# Patient Record
Sex: Female | Born: 1950 | Race: White | Hispanic: No | Marital: Married | Smoking: Never smoker
Health system: Southern US, Community
[De-identification: ages and names within clinical notes are randomized; demographics above are authoritative.]

## PROBLEM LIST (undated history)

## (undated) DIAGNOSIS — K219 Gastro-esophageal reflux disease without esophagitis: Secondary | ICD-10-CM

## (undated) DIAGNOSIS — Q2733 Arteriovenous malformation of digestive system vessel: Secondary | ICD-10-CM

## (undated) DIAGNOSIS — I10 Essential (primary) hypertension: Secondary | ICD-10-CM

## (undated) DIAGNOSIS — K5520 Angiodysplasia of colon without hemorrhage: Secondary | ICD-10-CM

## (undated) DIAGNOSIS — K648 Other hemorrhoids: Secondary | ICD-10-CM

## (undated) DIAGNOSIS — I7121 Aneurysm of the ascending aorta, without rupture: Secondary | ICD-10-CM

## (undated) DIAGNOSIS — E785 Hyperlipidemia, unspecified: Secondary | ICD-10-CM

## (undated) HISTORY — DX: Hyperlipidemia, unspecified: E78.5

## (undated) HISTORY — DX: Angiodysplasia of colon without hemorrhage: K55.20

## (undated) HISTORY — DX: Gastro-esophageal reflux disease without esophagitis: K21.9

## (undated) HISTORY — DX: Other hemorrhoids: K64.8

## (undated) HISTORY — PX: CHOLECYSTECTOMY: SHX55

## (undated) HISTORY — DX: Essential (primary) hypertension: I10

## (undated) HISTORY — DX: Aneurysm of the ascending aorta, without rupture: I71.21

## (undated) HISTORY — DX: Arteriovenous malformation of digestive system vessel: Q27.33

## (undated) HISTORY — PX: TONSILLECTOMY: SUR1361

---

## 2000-11-09 ENCOUNTER — Other Ambulatory Visit: Admission: RE | Admit: 2000-11-09 | Discharge: 2000-11-09 | Payer: Self-pay | Admitting: Obstetrics and Gynecology

## 2000-11-16 ENCOUNTER — Ambulatory Visit (HOSPITAL_COMMUNITY): Admission: RE | Admit: 2000-11-16 | Discharge: 2000-11-16 | Payer: Self-pay | Admitting: Obstetrics and Gynecology

## 2000-11-16 ENCOUNTER — Encounter: Payer: Self-pay | Admitting: Obstetrics and Gynecology

## 2002-07-05 ENCOUNTER — Encounter: Admission: RE | Admit: 2002-07-05 | Discharge: 2002-07-05 | Payer: Self-pay | Admitting: Family Medicine

## 2002-07-05 ENCOUNTER — Encounter: Payer: Self-pay | Admitting: Family Medicine

## 2004-02-14 ENCOUNTER — Ambulatory Visit (HOSPITAL_COMMUNITY): Admission: RE | Admit: 2004-02-14 | Discharge: 2004-02-14 | Payer: Self-pay | Admitting: Obstetrics and Gynecology

## 2005-08-06 ENCOUNTER — Ambulatory Visit (HOSPITAL_COMMUNITY): Admission: RE | Admit: 2005-08-06 | Discharge: 2005-08-06 | Payer: Self-pay | Admitting: Obstetrics and Gynecology

## 2006-11-22 ENCOUNTER — Ambulatory Visit (HOSPITAL_COMMUNITY): Admission: RE | Admit: 2006-11-22 | Discharge: 2006-11-22 | Payer: Self-pay | Admitting: Obstetrics and Gynecology

## 2006-11-29 ENCOUNTER — Other Ambulatory Visit: Admission: RE | Admit: 2006-11-29 | Discharge: 2006-11-29 | Payer: Self-pay | Admitting: Obstetrics and Gynecology

## 2007-12-29 ENCOUNTER — Encounter: Admission: RE | Admit: 2007-12-29 | Discharge: 2007-12-29 | Payer: Self-pay | Admitting: Obstetrics and Gynecology

## 2008-03-22 ENCOUNTER — Encounter (INDEPENDENT_AMBULATORY_CARE_PROVIDER_SITE_OTHER): Payer: Self-pay | Admitting: *Deleted

## 2008-03-22 ENCOUNTER — Ambulatory Visit (HOSPITAL_COMMUNITY): Admission: RE | Admit: 2008-03-22 | Discharge: 2008-03-22 | Payer: Self-pay | Admitting: *Deleted

## 2009-04-07 IMAGING — US US ABDOMEN COMPLETE
1 series · 13 of 25 positions shown · non-contrast
Comparison: Ultrasound of the abdomen of 07/05/2002

CLINICAL DATA: Right upper quadrant abdominal pain

ABDOMEN ULTRASOUND
TECHNIQUE: Complete abdominal ultrasound examination was performed
including evaluation of the liver, gallbladder, bile ducts,
pancreas, kidneys, spleen, IVC, and abdominal aorta.

[Series 1: us abdomen complete · 0.22mm/px · 13 of 69 slices shown]
[im 1/69]
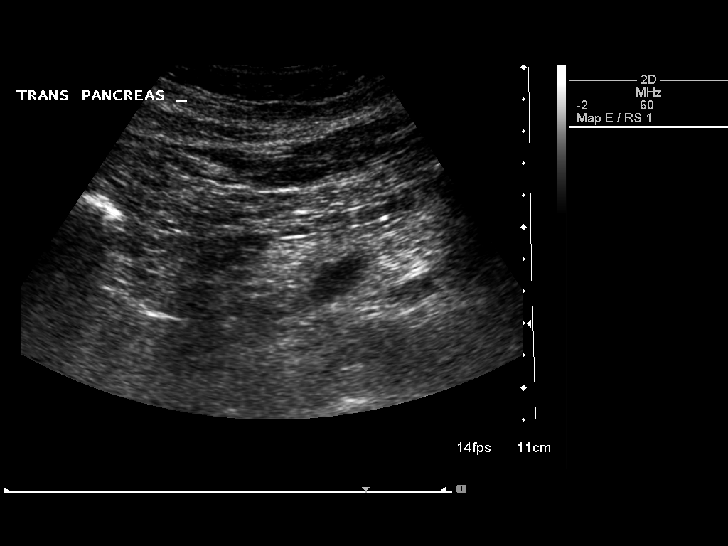
[im 6/69]
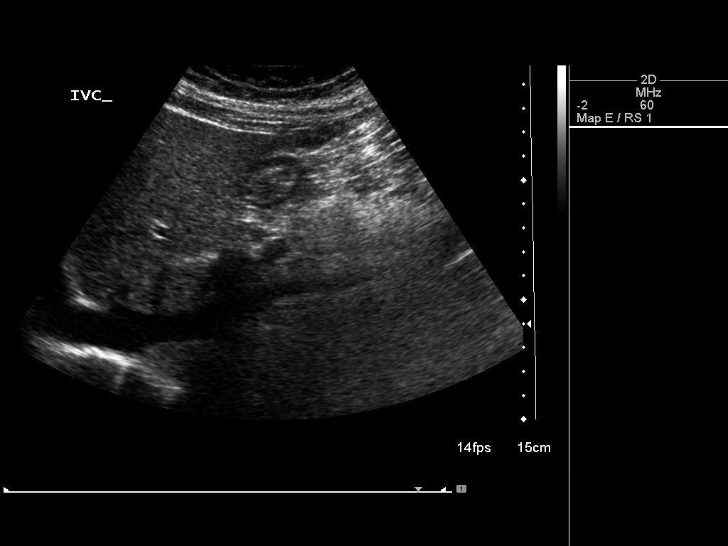
[im 12/69]
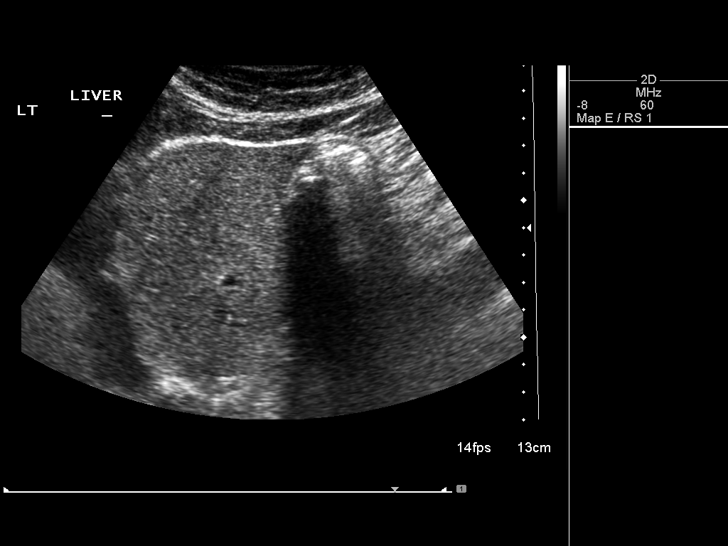
[im 18/69]
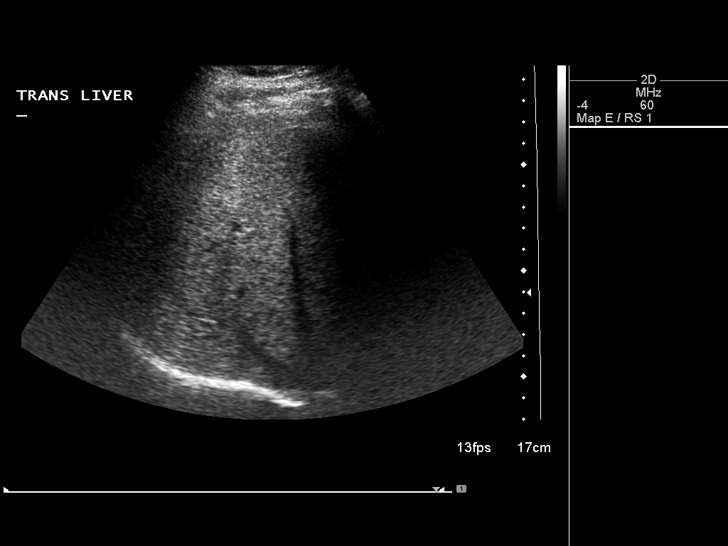
[im 23/69]
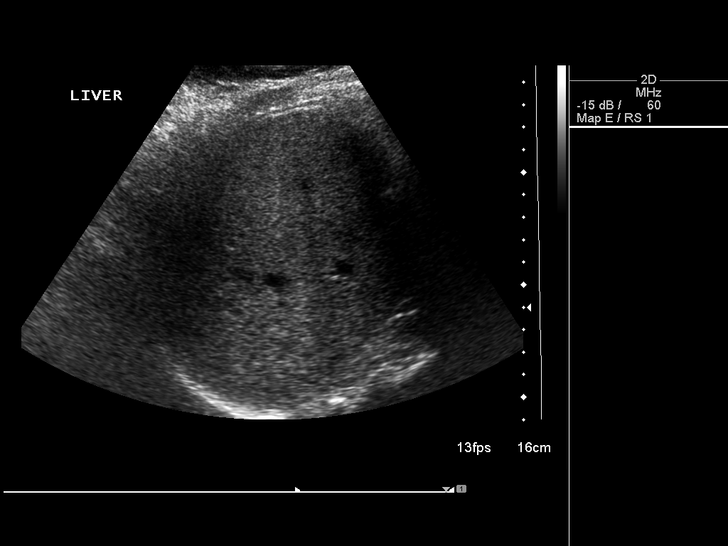
[im 29/69]
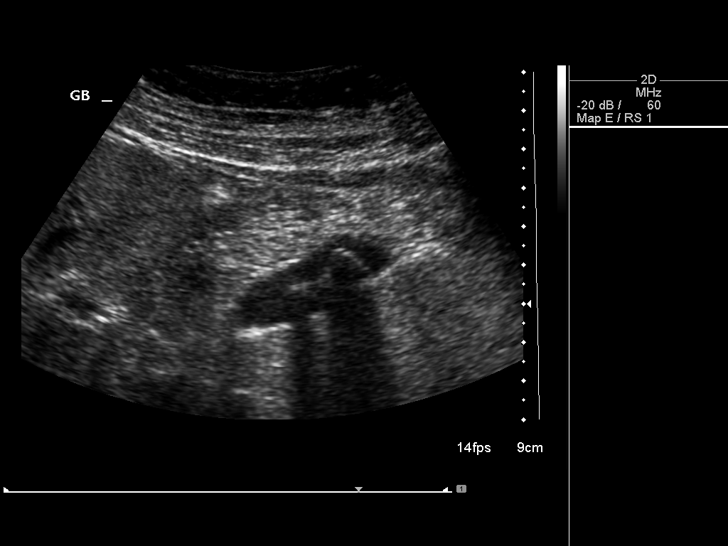
[im 35/69]
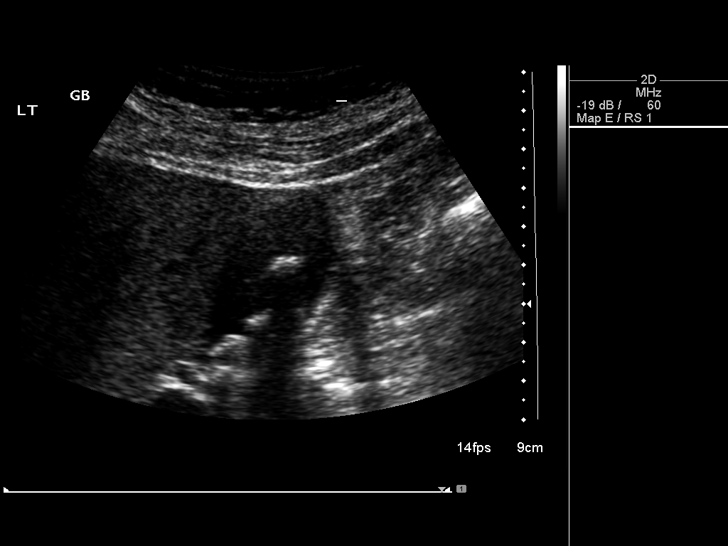
[im 40/69]
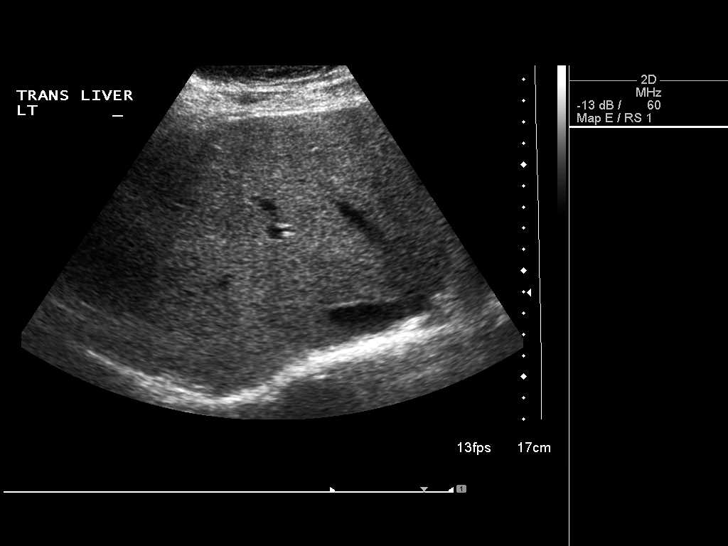
[im 46/69]
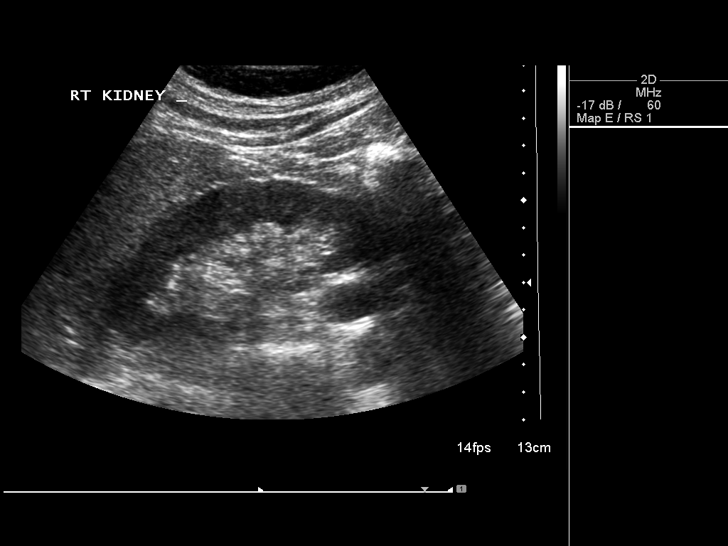
[im 52/69]
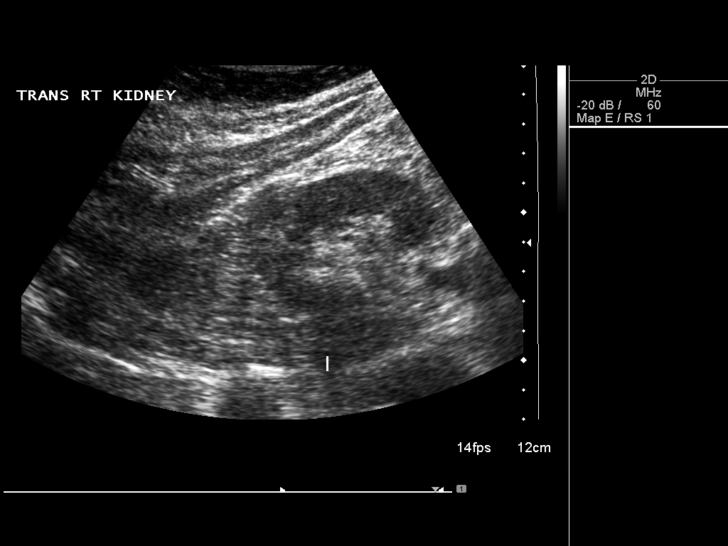
[im 57/69]
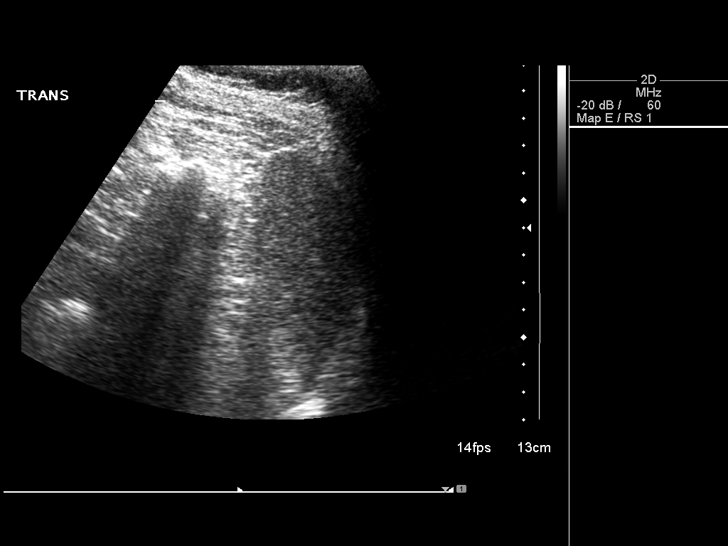
[im 63/69]
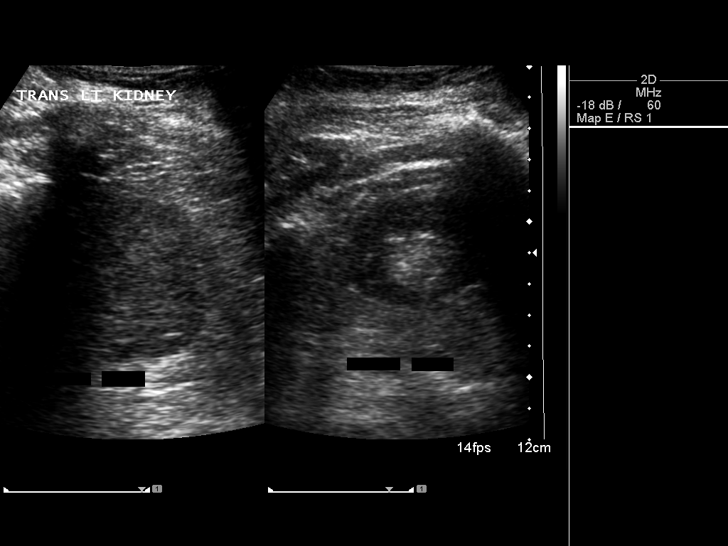
[im 69/69]
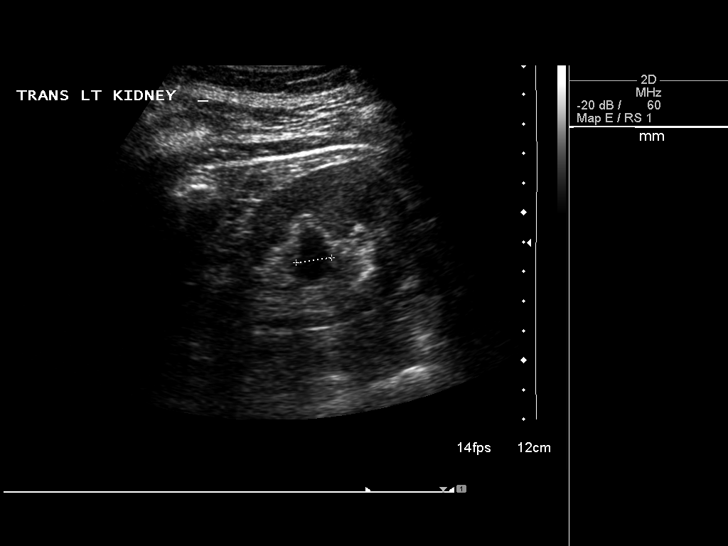

[13 of 25 positions shown; findings below may reference images not displayed]

FINDINGS: The gallbladder is well seen and there are several
echogenic gallstones of approximately 1 cm in diameter each.  No
gallbladder wall thickening is seen and no pain is present over the
gallbladder upon compression.  The liver has a normal echogenic
pattern.  The common bile duct is normal measuring 3.4 mm in
diameter.  The IVC and spleen appear normal, with portions of both
the head and the tail of the pancreas obscured by overlying bowel
gas.  No hydronephrosis is seen.  The right kidney measures 11.4 cm
sagittally, with left kidney measuring 12.0 cm.  A 2.0 cm left
parapelvic cyst is noted.  The abdominal aorta is normal in caliber
with no evidence of abdominal aortic aneurysm.
IMPRESSION: 1.  Several gallstones of approximately 1 cm in diameter.  No pain
is present over the gallbladder.
2.  Portions of the head and the tail of the pancreas are obscured
by bowel gas.

## 2010-04-23 LAB — CBC
HCT: 38.2 % (ref 36.0–46.0)
MCHC: 33.7 g/dL (ref 30.0–36.0)
MCV: 89.5 fL (ref 78.0–100.0)
Platelets: 234 10*3/uL (ref 150–400)
RDW: 13.1 % (ref 11.5–15.5)
WBC: 7.7 10*3/uL (ref 4.0–10.5)

## 2010-04-23 LAB — DIFFERENTIAL
Basophils Absolute: 0 10*3/uL (ref 0.0–0.1)
Lymphocytes Relative: 34 % (ref 12–46)
Monocytes Absolute: 0.5 10*3/uL (ref 0.1–1.0)
Neutro Abs: 4.4 10*3/uL (ref 1.7–7.7)

## 2010-04-23 LAB — COMPREHENSIVE METABOLIC PANEL
Albumin: 3.8 g/dL (ref 3.5–5.2)
BUN: 15 mg/dL (ref 6–23)
Chloride: 108 mEq/L (ref 96–112)
Creatinine, Ser: 0.83 mg/dL (ref 0.4–1.2)
Total Bilirubin: 0.6 mg/dL (ref 0.3–1.2)

## 2010-05-26 NOTE — Op Note (Signed)
Kelli Terrell, Kelli Terrell                   ACCOUNT NO.:  [id number]   MEDICAL RECORD NO.:  [id number]          PATIENT TYPE:  AMB   LOCATION:  DAY                          FACILITY:  [facility]   PHYSICIAN:  Alfonse Ras, MD   DATE OF BIRTH:  Jun 24, 1950   DATE OF PROCEDURE:  DATE OF DISCHARGE:                               OPERATIVE REPORT   PREOPERATIVE DIAGNOSIS:  Symptomatic cholelithiasis.   POSTOPERATIVE DIAGNOSIS:  Symptomatic cholelithiasis.   PROCEDURE:  Laparoscopic cholecystectomy.   SURGEON:  Alfonse Ras, MD   ASSISTANT:  None.   ANESTHESIA:  General.   HISTORY OF PRESENT ILLNESS:  The patient is a very pleasant 60 year old  white female who comes in with symptoms of symptomatic cholelithiasis  been worked up by Dr. Kyra Manges and sent for further evaluation and  treatment.  After a lengthy discussion and evaluation, informed consent  was granted.   DESCRIPTION:  The patient was taken to the operating room and placed in  supine position.  After adequate general anesthesia was induced using  endotracheal tube, the abdomen was prepped and draped with normal  sterile fashion.  Using a transverse infraumbilical incision, I  dissected down to the fascia.  It was opened vertically.  No Vicryl  pursestring suture was placed on the fascial defect.  Hasson trocar was  placed in the abdomen, and the abdomen was insufflated with continuously  with carbon dioxide.  Under direct vision, an additional 11-mm trocar  was placed in the right subxiphoid region and two 5-mm ports were placed  in the right abdomen.  Gallbladder which was very thin-walled was  identified and retracted with cephalad.  There were few filmy adhesions  which were taken down with sharp dissection.  In pulling the neck of the  gallbladder out laterally, a critical view was obtained of the cystic  duct, it was quite lengthy, its junction with the gallbladder was easily  identified.  It was dissected and triply  clipped and divided.  Cystic  artery was identified in a similar fashion.  Critical view was obtained.  It was triply clipped and divided.  Gallbladder was taken off the  gallbladder bed using Bovie electrocautery, placed in EndoCatch bag, and  removed through umbilical port.  Right upper quadrant was copiously  irrigated.  There was a small amount of bilious spillage.  All fluid  returned clear.  Adequate hemostasis was insured.  Pneumoperitoneum was  released.  The  infraumbilical fascial defect was closed with the O Vicryl pursestring  suture.  Skin incisions were closed with subcuticular 4-0 Monocryl and  all injected with 0.5 Marcaine.  Steri-Strips and sterile dressings were  applied.  The patient tolerated the procedure well and went to PACU in  good condition.      Alfonse Ras, MD  Electronically Signed     KRE/MEDQ  D:  03/22/2008  T:  03/22/2008  Job:  [id number]   cc:   Kyra Manges, MD

## 2011-02-21 ENCOUNTER — Other Ambulatory Visit: Payer: Self-pay | Admitting: Gastroenterology

## 2011-02-23 ENCOUNTER — Other Ambulatory Visit: Payer: Self-pay | Admitting: Gastroenterology

## 2011-02-25 ENCOUNTER — Other Ambulatory Visit: Payer: Self-pay | Admitting: Gastroenterology

## 2011-03-01 ENCOUNTER — Other Ambulatory Visit: Payer: Self-pay | Admitting: Gastroenterology

## 2011-03-01 NOTE — Telephone Encounter (Signed)
RETURNED PHARMACYS CALL. THIS IS NOT OUR PT. WE HAVE NEVER SEEN HER IN GI BEFORE. PER PHARMACY THEY FIGURED IT OUT AND HAVE CONTACTED PCP

## 2011-05-22 ENCOUNTER — Other Ambulatory Visit: Payer: Self-pay | Admitting: Gastroenterology

## 2011-10-04 ENCOUNTER — Ambulatory Visit
Admission: RE | Admit: 2011-10-04 | Discharge: 2011-10-04 | Disposition: A | Discharge status: Home / Self Care | Payer: BC Managed Care – PPO | Source: Ambulatory Visit | Attending: Family Medicine | Admitting: Family Medicine

## 2011-10-04 ENCOUNTER — Other Ambulatory Visit: Payer: Self-pay | Admitting: Family Medicine

## 2011-10-04 DIAGNOSIS — R05 Cough: Secondary | ICD-10-CM

## 2012-07-13 ENCOUNTER — Ambulatory Visit (INDEPENDENT_AMBULATORY_CARE_PROVIDER_SITE_OTHER): Payer: BC Managed Care – PPO | Admitting: Physician Assistant

## 2012-07-13 ENCOUNTER — Encounter: Payer: Self-pay | Admitting: Physician Assistant

## 2012-07-13 VITALS — BP 150/80 | HR 64 | Temp 98.1°F | Resp 18 | Ht 66.0 in | Wt 211.0 lb

## 2012-07-13 DIAGNOSIS — H6591 Unspecified nonsuppurative otitis media, right ear: Secondary | ICD-10-CM

## 2012-07-13 DIAGNOSIS — H6590 Unspecified nonsuppurative otitis media, unspecified ear: Secondary | ICD-10-CM

## 2012-07-13 MED ORDER — AMOXICILLIN-POT CLAVULANATE 875-125 MG PO TABS: 1.0000 | ORAL_TABLET | Freq: Two times a day (BID) | ORAL | Status: DC

## 2012-07-16 NOTE — Progress Notes (Signed)
   Patient ID: JOSEFA SYRACUSE MRN: [id number], DOB: [birth year], 62 y.o. Date of Encounter: 07/16/2012, 4:41 PM    Chief Complaint:  Chief Complaint  Patient presents with  . Otalgia    Right     HPI: 62 y.o. year old white female presentsmale presents with c/o pain in right ear. Says has had "shooting pains" in her right ear for a couple of days. Back around 06/30/12 she had "cold symtoms" but htose have resolved. Now has no nasal congestion or rhinorrhea, no cough, no ST, no fever/chills.    Home Meds: See attached medication section for any medications that were entered at today's visit. The computer does not put those onto this list.The following list is a list of meds entered prior to today's visit.   No current outpatient prescriptions on file prior to visit.   No current facility-administered medications on file prior to visit.    Allergies:  Allergies  Allergen Reactions  . Codeine Nausea And Vomiting      Review of Systems: See HPI for pertinent ROS. All other ROS negative.    Physical Exam: Blood pressure 150/80, pulse 64, temperature 98.1 F (36.7 C), temperature source Oral, resp. rate 18, height 5\' 6"  (1.676 m), weight 211 lb (95.709 kg)., Body mass index is 34.07 kg/(m^2). General: WNWD WF. Appears in no acute distress. HEENT: Normocephalic, atraumatic, eyes without discharge, sclera non-icteric, nares are without discharge. Bilateral auditory canals clear, TM's are without perforation. Left TM is pearly grey and translucent with reflective cone of light. Right TM has golden effusion in inferior portion of the TM. Oral cavity moist, posterior pharynx without exudate, erythema, peritonsillar abscess, or post nasal drip.  Neck: Supple. No thyromegaly. No lymphadenopathy. Lungs: Clear bilaterally to auscultation without wheezes, rales, or rhonchi. Breathing is unlabored. Heart: Regular rhythm. No murmurs, rubs, or gallops. Msk:  Strength and tone normal for age. Extremities/Skin: Warm  and dry.  Neuro: Alert and oriented X 3. Moves all extremities spontaneously. Gait is normal. CNII-XII grossly in tact. Psych:  Responds to questions appropriately with a normal affect.     ASSESSMENT AND PLAN:  62 y.o. year old female with with  1. Right otitis media with effusion - amoxicillin-clavulanate (AUGMENTIN) 875-125 MG per tablet; Take 1 tablet by mouth 2 (two) times daily.  Dispense: 20 tablet; Refill: 0 Use Tylenol/Motrin prn pain. F/U if does not resolve.   [address] [place], [place], [facility] 07/16/2012 4:41 PM

## 2012-08-14 ENCOUNTER — Other Ambulatory Visit: Payer: Self-pay | Admitting: Family Medicine

## 2012-09-26 ENCOUNTER — Other Ambulatory Visit: Payer: Self-pay | Admitting: Family Medicine

## 2012-11-23 ENCOUNTER — Other Ambulatory Visit (INDEPENDENT_AMBULATORY_CARE_PROVIDER_SITE_OTHER): Payer: BC Managed Care – PPO

## 2012-11-23 DIAGNOSIS — Z23 Encounter for immunization: Secondary | ICD-10-CM

## 2012-11-23 DIAGNOSIS — Z0000 Encounter for general adult medical examination without abnormal findings: Secondary | ICD-10-CM

## 2012-11-23 LAB — COMPLETE METABOLIC PANEL WITH GFR
ALT: 13 U/L (ref 0–35)
AST: 16 U/L (ref 0–37)
Creat: 0.81 mg/dL (ref 0.50–1.10)
GFR, Est African American: 89 mL/min
Total Bilirubin: 0.4 mg/dL (ref 0.3–1.2)

## 2012-11-23 LAB — CBC WITH DIFFERENTIAL/PLATELET
HCT: 38.1 % (ref 36.0–46.0)
Hemoglobin: 12.9 g/dL (ref 12.0–15.0)
Lymphocytes Relative: 33 % (ref 12–46)
Monocytes Absolute: 0.6 10*3/uL (ref 0.1–1.0)
Monocytes Relative: 7 % (ref 3–12)
Neutro Abs: 4.4 10*3/uL (ref 1.7–7.7)
WBC: 7.7 10*3/uL (ref 4.0–10.5)

## 2012-11-23 LAB — LIPID PANEL
HDL: 60 mg/dL (ref >39)
LDL Cholesterol: 137 mg/dL — ABNORMAL HIGH (ref 0–99)
Triglycerides: 91 mg/dL (ref <150)
VLDL: 18 mg/dL (ref 0–40)

## 2012-12-14 ENCOUNTER — Encounter: Payer: Self-pay | Admitting: Physician Assistant

## 2012-12-14 ENCOUNTER — Ambulatory Visit (INDEPENDENT_AMBULATORY_CARE_PROVIDER_SITE_OTHER): Payer: BC Managed Care – PPO | Admitting: Physician Assistant

## 2012-12-14 VITALS — BP 170/94 | HR 68 | Temp 98.2°F | Resp 18 | Ht 64.5 in | Wt 214.0 lb

## 2012-12-14 DIAGNOSIS — K219 Gastro-esophageal reflux disease without esophagitis: Secondary | ICD-10-CM

## 2012-12-14 DIAGNOSIS — E785 Hyperlipidemia, unspecified: Secondary | ICD-10-CM | POA: Insufficient documentation

## 2012-12-14 DIAGNOSIS — R6889 Other general symptoms and signs: Secondary | ICD-10-CM

## 2012-12-14 DIAGNOSIS — IMO0001 Reserved for inherently not codable concepts without codable children: Secondary | ICD-10-CM | POA: Insufficient documentation

## 2012-12-14 DIAGNOSIS — I10 Essential (primary) hypertension: Secondary | ICD-10-CM

## 2012-12-14 DIAGNOSIS — Z23 Encounter for immunization: Secondary | ICD-10-CM

## 2012-12-14 DIAGNOSIS — Z0000 Encounter for general adult medical examination without abnormal findings: Secondary | ICD-10-CM

## 2012-12-14 DIAGNOSIS — L570 Actinic keratosis: Secondary | ICD-10-CM

## 2012-12-14 MED ORDER — FLUOROURACIL 0.5 % EX CREA: TOPICAL_CREAM | Freq: Every day | CUTANEOUS | Status: DC

## 2012-12-14 MED ORDER — AMLODIPINE BESYLATE 5 MG PO TABS: 5.0000 mg | ORAL_TABLET | Freq: Every day | ORAL | Status: DC

## 2012-12-14 NOTE — Addendum Note (Signed)
Addended by: Donne Anon on: 12/14/2012 09:49 AM   Modules accepted: Orders

## 2012-12-14 NOTE — Progress Notes (Signed)
Patient ID: Kelli Terrell MRN: [id number], DOB: 01-30-1950, 62 y.o. Date of Encounter: 12/14/2012,   Chief Complaint: Physical (CPE)  HPI: 62 y.o. y/o female  here for CPE.   She has a skin lesion on her right hand that she says continues to bother her. As it is constantly scaly and irritating. He also requests a refill on a cream that was prescribed by her dermatologist many years ago. She thinks that it is expired. Since she was prescribed it to use on similar scaly type lesions on her forehead.  I asked her about her HRT. Says  she has significant hot flashes. She has gone off of the medication multiple times but every time she has severe hot flashes. She does take the medication every other day.  She is taking the HCTZ as prescribed. However she did bring in a blood pressure log from checking it at home. She has readings from about one reading per month ever since February. Even back in February she was getting 169/88. Other monthly readings have been 167/82, 144/84, 150/80. She did 3 readings during the month of November and they were 147/86, 154/88, 150 /82.  SHe says that she use Benicar and lisinopril in the past but they both caused her blood pressure to get too low.  Review of Systems: Consitutional: No fever, chills, fatigue, night sweats, lymphadenopathy. No significant/unexplained weight changes. Eyes: No visual changes, eye redness, or discharge. ENT/Mouth: No ear pain, sore throat, nasal drainage, or sinus pain. Cardiovascular: No chest pressure,heaviness, tightness or squeezing, even with exertion. No increased shortness of breath or dyspnea on exertion.No palpitations, edema, orthopnea, PND. Respiratory: No cough, hemoptysis, SOB, or wheezing. Gastrointestinal: No anorexia, dysphagia, reflux, pain, nausea, vomiting, hematemesis, diarrhea, constipation, BRBPR, or melena. Breast: No mass, nodules, bulging, or retraction. No skin changes or inflammation. No nipple  discharge. No lymphadenopathy. Genitourinary: No dysuria, hematuria, incontinence, vaginal discharge, pruritis, burning, abnormal bleeding, or pain. Musculoskeletal: No decreased ROM, No joint pain or swelling. No significant pain in neck, back, or extremities. Skin: No rash, pruritis, or concerning lesions. Neurological: No headache, dizziness, syncope, seizures, tremors, memory loss, coordination problems, or paresthesias. Psychological: No anxiety, depression, hallucinations, SI/HI. Endocrine: No polydipsia, polyphagia, polyuria, or known diabetes.No increased fatigue. No palpitations/rapid heart rate. No significant/unexplained weight change. All other systems were reviewed and are otherwise negative.  Past Medical History  Diagnosis Date  . Hyperlipidemia   . Hypertension   . GERD (gastroesophageal reflux disease)      Past Surgical History  Procedure Laterality Date  . Cholecystectomy    . Tonsillectomy      Home Meds:  Current Outpatient Prescriptions on File Prior to Visit  Medication Sig Dispense Refill  . aspirin 81 MG tablet Take 81 mg by mouth daily.      . Biotin 1 MG CAPS Take by mouth.      . Cholecalciferol (VITAMIN D) 2000 UNITS CAPS Take by mouth.      . hydrochlorothiazide (HYDRODIURIL) 25 MG tablet Take 25 mg by mouth daily.      Marland Kitchen MIMVEY 1-0.5 MG per tablet TAKE 1 TABLET BY MOUTH DAILY  28 tablet  6  . omeprazole (PRILOSEC) 40 MG capsule TAKE 1 CAPSULE EVERY DAY  30 capsule  11   No current facility-administered medications on file prior to visit.    Allergies:  Allergies  Allergen Reactions  . Codeine Nausea And Vomiting    History   Social History  . Marital Status:  Married    Spouse Name: N/A    Number of Children: N/A  . Years of Education: N/A   Occupational History  . Not on file.   Social History Main Topics  . Smoking status: Never Smoker   . Smokeless tobacco: Never Used  . Alcohol Use: Yes     Comment: Rare  . Drug Use: No  .  Sexual Activity: Not on file   Other Topics Concern  . Not on file   Social History Narrative   Married. Retired.    Never smoked.     Family History  Problem Relation Age of Onset  . Cancer Father     kidney  . Stroke Brother   . Cancer Brother     Thyroid Cancer  . Heart disease Brother     Physical Exam: Blood pressure 170/94, pulse 68, temperature 98.2 F (36.8 C), temperature source Oral, resp. rate 18, height 5' 4.5" (1.638 m), weight 214 lb (97.07 kg)., Body mass index is 36.18 kg/(m^2). General: Well developed, well nourished,WF. in no acute distress. HEENT: Normocephalic, atraumatic. Conjunctiva pink, sclera non-icteric. Pupils 2 mm constricting to 1 mm, round, regular, and equally reactive to light and accomodation. EOMI. Internal auditory canal clear. TMs with good cone of light and without pathology. Nasal mucosa pink. Nares are without discharge. No sinus tenderness. Oral mucosa pink.  Pharynx without exudate.   Neck: Supple. Trachea midline. No thyromegaly. Full ROM. No lymphadenopathy.No Carotid Bruits. Lungs: Clear to auscultation bilaterally without wheezes, rales, or rhonchi. Breathing is of normal effort and unlabored. Cardiovascular: RRR with S1 S2. No murmurs, rubs, or gallops. Distal pulses 2+ symmetrically. No carotid or abdominal bruits. Breast: Symmetrical. No masses. Nipples without discharge. Abdomen: Soft, non-tender, non-distended with normoactive bowel sounds. No hepatosplenomegaly or masses. No rebound/guarding. No CVA tenderness. No hernias.  Genitourinary:  External genitalia without lesions. Vaginal mucosa pink.No discharge present. Cervix pink and without discharge. No cervical tenderness.Normal uterus size. No adnexal mass or tenderness.  Pap smear taken Musculoskeletal: Full range of motion and 5/5 strength throughout. Without swelling, atrophy, tenderness, crepitus, or warmth. Extremities without clubbing, cyanosis, or edema. Calves supple. Skin:  Warm and moist without erythema, ecchymosis, wounds, or rash. Dorsum of right hand: There is a 0.75 cm diameter area of hyperkeratosis and scaling. Neuro: A+Ox3. CN II-XII grossly intact. Moves all extremities spontaneously. Full sensation throughout. Normal gait. DTR 2+ throughout upper and lower extremities. Finger to nose intact. Psych:  Responds to questions appropriately with a normal affect.   Assessment/Plan:  62 y.o. y/o female here for CPE 1. Visit for preventive health examination  A. Screening Labs: Results for orders placed in visit on 11/23/12  CBC WITH DIFFERENTIAL      Result Value Range   WBC 7.7  4.0 - 10.5 K/uL   RBC 4.41  3.87 - 5.11 MIL/uL   Hemoglobin 12.9  12.0 - 15.0 g/dL   HCT 16.1  09.6 - 04.5 %   MCV 86.4  78.0 - 100.0 fL   MCH 29.3  26.0 - 34.0 pg   MCHC 33.9  30.0 - 36.0 g/dL   RDW 40.9  81.1 - 91.4 %   Platelets 278  150 - 400 K/uL   Neutrophils Relative % 57  43 - 77 %   Neutro Abs 4.4  1.7 - 7.7 K/uL   Lymphocytes Relative 33  12 - 46 %   Lymphs Abs 2.6  0.7 - 4.0 K/uL   Monocytes Relative 7  3 - 12 %   Monocytes Absolute 0.6  0.1 - 1.0 K/uL   Eosinophils Relative 2  0 - 5 %   Eosinophils Absolute 0.2  0.0 - 0.7 K/uL   Basophils Relative 1  0 - 1 %   Basophils Absolute 0.1  0.0 - 0.1 K/uL   Smear Review Criteria for review not met    LIPID PANEL      Result Value Range   Cholesterol 215 (*) 0 - 200 mg/dL   Triglycerides 91  <161 mg/dL   HDL 60  >09 mg/dL   Total CHOL/HDL Ratio 3.6     VLDL 18  0 - 40 mg/dL   LDL Cholesterol 604 (*) 0 - 99 mg/dL  COMPLETE METABOLIC PANEL WITH GFR      Result Value Range   Sodium 138  135 - 145 mEq/L   Potassium 4.3  3.5 - 5.3 mEq/L   Chloride 102  96 - 112 mEq/L   CO2 27  19 - 32 mEq/L   Glucose, Bld 86  70 - 99 mg/dL   BUN 15  6 - 23 mg/dL   Creat 5.40  9.81 - 1.91 mg/dL   Total Bilirubin 0.4  0.3 - 1.2 mg/dL   Alkaline Phosphatase 75  39 - 117 U/L   AST 16  0 - 37 U/L   ALT 13  0 - 35 U/L   Total  Protein 6.9  6.0 - 8.3 g/dL   Albumin 3.9  3.5 - 5.2 g/dL   Calcium 9.8  8.4 - 47.8 mg/dL   GFR, Est African American 89     GFR, Est Non African American 77      B. Pap: Last was done 04/01/2011. Showed ASCUS. HPV was negative. Repeat PAP now.  C. Screening Mammogram: Last was April 2014. Negative.  D. Colorectal Cancer Screening: She reports that she had colonoscopy by Dr. Kinnie Scales September 2014. Positive polyps. Repeat 3 years.  F. Immunizations:  Influenza: Patient has had influenza vaccine for this year. Zostavax: Given here 02/14/2008 Tetanus: Overdue. Patient agreeable to get now. Tdap now.  Pneumonia vaccine: Will administer it when she is a 63. Currently 62.  2. Hypertension Pressure is uncontrolled. Continue HCTZ. Add amlodipine 5 mg. She is to check her blood pressure in 2 weeks. If blood pressure is still 140/90 or greater, and she is to call so I can increase medications. - amLODipine (NORVASC) 5 MG tablet; Take 1 tablet (5 mg total) by mouth daily.  Dispense: 30 tablet; Refill: 3  3. GERD (gastroesophageal reflux disease) Controlled with current medication.  4. ASCUS (atypical squamous cells of undetermined significance) on Pap smear Pap smear 04/01/11 showed ASCUS. HPV not detected. Repeat Pap smear now.  5. Hyperlipidemia LDL is slightly elevated but does not require medication. Continue to try to decrease saturated fats in diet.  6. Actinic keratosis Refill medication prescribed by dermatologist in the past. - fluoruracil (CARAC) 0.5 % cream; Apply topically daily.  Dispense: 30 g; Refill: 0  Actinic keratosis on the right hand: Cryotherapy applied x4 freeze off cycles.  HotFlashes: See history of present illness. Patient has decreased that medicine to every other day. She is aware of the risk of HRT. However she did have severe hot flashes when stops medication.  Signed, [address] [place], [place], [facility] 12/14/2012 9:09 AM

## 2012-12-18 ENCOUNTER — Encounter: Payer: Self-pay | Admitting: Family Medicine

## 2012-12-18 LAB — PAP, THIN PREP W/HPV RFLX HPV TYPE 16/18

## 2012-12-21 ENCOUNTER — Telehealth: Payer: Self-pay | Admitting: Family Medicine

## 2012-12-21 NOTE — Telephone Encounter (Signed)
[facility] pharmacy states Fluorouracil 0.5% cream is on back order.  Requesting substitution.

## 2012-12-25 NOTE — Telephone Encounter (Signed)
Tell patient if there are  no other topical treatments for this.( Dermatology had Rxed this cream to use for Actinic Keratosis) If she develops areas with significant scaling and thickened skin come in and we can evaluate and possibly do cryotherapy.

## 2012-12-26 NOTE — Telephone Encounter (Signed)
Pharmacy was called.  No idea when cream will be available from manufacturer.  Also spoke to patient.  Let her know there was not an alternative treatment.  Pharmacy will continue to obtain for her.  If condition flares up she can come to office and provider can do cryotherapy temporarily until medication available again.

## 2013-01-17 ENCOUNTER — Other Ambulatory Visit: Payer: Self-pay | Admitting: Dermatology

## 2013-01-26 ENCOUNTER — Other Ambulatory Visit: Payer: Self-pay | Admitting: Family Medicine

## 2013-01-26 MED ORDER — HYDROCHLOROTHIAZIDE 25 MG PO TABS: 25.0000 mg | ORAL_TABLET | Freq: Every day | ORAL | Status: DC

## 2013-01-26 MED ORDER — OMEPRAZOLE 40 MG PO CPDR: DELAYED_RELEASE_CAPSULE | ORAL | Status: DC

## 2013-01-26 NOTE — Telephone Encounter (Signed)
Rx Refilled for 90 day supply 

## 2013-02-23 ENCOUNTER — Other Ambulatory Visit: Payer: Self-pay | Admitting: *Deleted

## 2013-02-23 MED ORDER — HYDROCHLOROTHIAZIDE 25 MG PO TABS: 25.0000 mg | ORAL_TABLET | Freq: Every day | ORAL | Status: DC

## 2013-02-23 MED ORDER — OMEPRAZOLE 40 MG PO CPDR: DELAYED_RELEASE_CAPSULE | ORAL | Status: DC

## 2013-02-23 NOTE — Telephone Encounter (Signed)
Refill appropriate and filled per protocol. 

## 2013-03-27 ENCOUNTER — Telehealth: Payer: Self-pay | Admitting: Family Medicine

## 2013-03-27 DIAGNOSIS — I10 Essential (primary) hypertension: Secondary | ICD-10-CM

## 2013-03-27 MED ORDER — AMLODIPINE BESYLATE 5 MG PO TABS: 5.0000 mg | ORAL_TABLET | Freq: Every day | ORAL | Status: DC

## 2013-03-27 NOTE — Telephone Encounter (Signed)
Medication refilled per protocol. 

## 2013-07-18 ENCOUNTER — Other Ambulatory Visit: Payer: Self-pay | Admitting: Dermatology

## 2013-08-11 ENCOUNTER — Other Ambulatory Visit: Payer: Self-pay | Admitting: Family Medicine

## 2013-09-20 ENCOUNTER — Other Ambulatory Visit: Payer: Self-pay | Admitting: Physician Assistant

## 2013-10-01 ENCOUNTER — Telehealth: Payer: Self-pay | Admitting: Family Medicine

## 2013-10-01 DIAGNOSIS — I10 Essential (primary) hypertension: Secondary | ICD-10-CM

## 2013-10-01 MED ORDER — AMLODIPINE BESYLATE 5 MG PO TABS: 5.0000 mg | ORAL_TABLET | Freq: Every day | ORAL | Status: DC

## 2013-10-01 NOTE — Telephone Encounter (Signed)
Medication refilled per protocol. 

## 2013-12-17 ENCOUNTER — Other Ambulatory Visit: Payer: Medicare Other

## 2013-12-17 DIAGNOSIS — E785 Hyperlipidemia, unspecified: Secondary | ICD-10-CM

## 2013-12-17 DIAGNOSIS — I10 Essential (primary) hypertension: Secondary | ICD-10-CM

## 2013-12-17 DIAGNOSIS — Z0000 Encounter for general adult medical examination without abnormal findings: Secondary | ICD-10-CM

## 2013-12-17 DIAGNOSIS — K219 Gastro-esophageal reflux disease without esophagitis: Secondary | ICD-10-CM

## 2013-12-17 LAB — LIPID PANEL
CHOL/HDL RATIO: 3.5 ratio
CHOLESTEROL: 198 mg/dL (ref 0–200)
HDL: 57 mg/dL (ref >39)
LDL Cholesterol: 123 mg/dL — ABNORMAL HIGH (ref 0–99)
TRIGLYCERIDES: 90 mg/dL (ref <150)
VLDL: 18 mg/dL (ref 0–40)

## 2013-12-17 LAB — COMPLETE METABOLIC PANEL WITH GFR
ALT: 13 U/L (ref 0–35)
AST: 14 U/L (ref 0–37)
Albumin: 3.9 g/dL (ref 3.5–5.2)
Alkaline Phosphatase: 72 U/L (ref 39–117)
BUN: 13 mg/dL (ref 6–23)
CALCIUM: 9.9 mg/dL (ref 8.4–10.5)
CHLORIDE: 104 meq/L (ref 96–112)
CO2: 24 meq/L (ref 19–32)
CREATININE: 0.89 mg/dL (ref 0.50–1.10)
GFR, EST AFRICAN AMERICAN: 79 mL/min
GFR, EST NON AFRICAN AMERICAN: 68 mL/min
Glucose, Bld: 90 mg/dL (ref 70–99)
Potassium: 4 mEq/L (ref 3.5–5.3)
Sodium: 139 mEq/L (ref 135–145)
Total Bilirubin: 0.4 mg/dL (ref 0.2–1.2)
Total Protein: 6.8 g/dL (ref 6.0–8.3)

## 2013-12-17 LAB — CBC WITH DIFFERENTIAL/PLATELET
Basophils Absolute: 0.1 10*3/uL (ref 0.0–0.1)
Basophils Relative: 1 % (ref 0–1)
EOS ABS: 0.1 10*3/uL (ref 0.0–0.7)
EOS PCT: 2 % (ref 0–5)
HCT: 38.5 % (ref 36.0–46.0)
HEMOGLOBIN: 12.8 g/dL (ref 12.0–15.0)
LYMPHS ABS: 2.4 10*3/uL (ref 0.7–4.0)
LYMPHS PCT: 34 % (ref 12–46)
MCH: 28.7 pg (ref 26.0–34.0)
MCHC: 33.2 g/dL (ref 30.0–36.0)
MCV: 86.3 fL (ref 78.0–100.0)
MONO ABS: 0.4 10*3/uL (ref 0.1–1.0)
MPV: 11.7 fL (ref 9.4–12.4)
Monocytes Relative: 6 % (ref 3–12)
Neutro Abs: 4 10*3/uL (ref 1.7–7.7)
Neutrophils Relative %: 57 % (ref 43–77)
PLATELETS: 257 10*3/uL (ref 150–400)
RBC: 4.46 MIL/uL (ref 3.87–5.11)
RDW: 13.7 % (ref 11.5–15.5)
WBC: 7.1 10*3/uL (ref 4.0–10.5)

## 2013-12-20 ENCOUNTER — Ambulatory Visit (INDEPENDENT_AMBULATORY_CARE_PROVIDER_SITE_OTHER): Payer: Medicare Other | Admitting: Family Medicine

## 2013-12-20 ENCOUNTER — Encounter: Payer: Self-pay | Admitting: Family Medicine

## 2013-12-20 VITALS — BP 132/80 | HR 78 | Temp 98.5°F | Resp 16 | Ht 65.0 in | Wt 210.0 lb

## 2013-12-20 DIAGNOSIS — Z0000 Encounter for general adult medical examination without abnormal findings: Secondary | ICD-10-CM

## 2013-12-20 DIAGNOSIS — Z23 Encounter for immunization: Secondary | ICD-10-CM

## 2013-12-20 NOTE — Progress Notes (Signed)
Subjective:    Patient ID: Kelli Terrell, female    DOB: 1950-02-20, 63 y.o.   MRN: [id number]  HPI Patient is a very pleasant 63 year old white female who is here today for complete physical exam. Her last colonoscopy was almost 3 years ago. She is due for repeat colonoscopy in September 2016 due to colon polyps. She is overdue for mammogram. She is overdue for a bone density test. Patient is also due for a flu shot as well as Pneumovax 23. Her shingles vaccine is up-to-date. She denies any problems with falls or depression. She denies any problems with memory. She denies any problems performing activities of daily living. Her most recent lab work as listed below: Lab on 12/17/2013  Component Date Value Ref Range Status  . Cholesterol 12/17/2013 198  0 - 200 mg/dL Final   Comment: ATP III Classification:       < 200        mg/dL        Desirable      200 - 239     mg/dL        Borderline High      >= 240        mg/dL        High     . Triglycerides 12/17/2013 90  <150 mg/dL Final  . HDL 12/17/2013 57  >39 mg/dL Final  . Total CHOL/HDL Ratio 12/17/2013 3.5   Final  . VLDL 12/17/2013 18  0 - 40 mg/dL Final  . LDL Cholesterol 12/17/2013 123* 0 - 99 mg/dL Final   Comment:   Total Cholesterol/HDL Ratio:CHD Risk                        Coronary Heart Disease Risk Table                                        Men       Women          1/2 Average Risk              3.4        3.3              Average Risk              5.0        4.4           2X Average Risk              9.6        7.1           3X Average Risk             23.4       11.0 Use the calculated Patient Ratio above and the CHD Risk table  to determine the patient's CHD Risk. ATP III Classification (LDL):       < 100        mg/dL         Optimal      100 - 129     mg/dL         Near or Above Optimal      130 - 159     mg/dL         Borderline High      160 - 189  mg/dL         High       > 190        mg/dL         Very High       . WBC 12/17/2013 7.1  4.0 - 10.5 K/uL Final  . RBC 12/17/2013 4.46  3.87 - 5.11 MIL/uL Final  . Hemoglobin 12/17/2013 12.8  12.0 - 15.0 g/dL Final  . HCT 12/17/2013 38.5  36.0 - 46.0 % Final  . MCV 12/17/2013 86.3  78.0 - 100.0 fL Final  . MCH 12/17/2013 28.7  26.0 - 34.0 pg Final  . MCHC 12/17/2013 33.2  30.0 - 36.0 g/dL Final  . RDW 12/17/2013 13.7  11.5 - 15.5 % Final  . Platelets 12/17/2013 257  150 - 400 K/uL Final  . MPV 12/17/2013 11.7  9.4 - 12.4 fL Final  . Neutrophils Relative % 12/17/2013 57  43 - 77 % Final  . Neutro Abs 12/17/2013 4.0  1.7 - 7.7 K/uL Final  . Lymphocytes Relative 12/17/2013 34  12 - 46 % Final  . Lymphs Abs 12/17/2013 2.4  0.7 - 4.0 K/uL Final  . Monocytes Relative 12/17/2013 6  3 - 12 % Final  . Monocytes Absolute 12/17/2013 0.4  0.1 - 1.0 K/uL Final  . Eosinophils Relative 12/17/2013 2  0 - 5 % Final  . Eosinophils Absolute 12/17/2013 0.1  0.0 - 0.7 K/uL Final  . Basophils Relative 12/17/2013 1  0 - 1 % Final  . Basophils Absolute 12/17/2013 0.1  0.0 - 0.1 K/uL Final  . Smear Review 12/17/2013 Criteria for review not met   Final  . Sodium 12/17/2013 139  135 - 145 mEq/L Final  . Potassium 12/17/2013 4.0  3.5 - 5.3 mEq/L Final  . Chloride 12/17/2013 104  96 - 112 mEq/L Final  . CO2 12/17/2013 24  19 - 32 mEq/L Final  . Glucose, Bld 12/17/2013 90  70 - 99 mg/dL Final  . BUN 12/17/2013 13  6 - 23 mg/dL Final  . Creat 12/17/2013 0.89  0.50 - 1.10 mg/dL Final  . Total Bilirubin 12/17/2013 0.4  0.2 - 1.2 mg/dL Final  . Alkaline Phosphatase 12/17/2013 72  39 - 117 U/L Final  . AST 12/17/2013 14  0 - 37 U/L Final  . ALT 12/17/2013 13  0 - 35 U/L Final  . Total Protein 12/17/2013 6.8  6.0 - 8.3 g/dL Final  . Albumin 12/17/2013 3.9  3.5 - 5.2 g/dL Final  . Calcium 12/17/2013 9.9  8.4 - 10.5 mg/dL Final  . GFR, Est African American 12/17/2013 79   Final  . GFR, Est Non African American 12/17/2013 68   Final   Comment:   The estimated GFR is a  calculation valid for adults (>=95 years old) that uses the CKD-EPI algorithm to adjust for age and sex. It is   not to be used for children, pregnant women, hospitalized patients,    patients on dialysis, or with rapidly changing kidney function. According to the NKDEP, eGFR >89 is normal, 60-89 shows mild impairment, 30-59 shows moderate impairment, 15-29 shows severe impairment and <15 is ESRD.      Past Medical History  Diagnosis Date  . Hyperlipidemia   . Hypertension   . GERD (gastroesophageal reflux disease)    Past Surgical History  Procedure Laterality Date  . Cholecystectomy    . Tonsillectomy     Current Outpatient Prescriptions on File Prior to Visit  Medication Sig Dispense Refill  . amLODipine (NORVASC) 5 MG tablet Take 1 tablet (5 mg total) by mouth daily. 90 tablet 0  . aspirin 81 MG tablet Take 81 mg by mouth daily.    . Biotin 1 MG CAPS Take by mouth.    . Cholecalciferol (VITAMIN D) 2000 UNITS CAPS Take by mouth.    . fluoruracil (CARAC) 0.5 % cream Apply topically daily. 30 g 0  . hydrochlorothiazide (HYDRODIURIL) 25 MG tablet Take 1 tablet (25 mg total) by mouth daily. 90 tablet 3  . MIMVEY 1-0.5 MG per tablet TAKE 1 TABLET BY MOUTH DAILY 28 tablet 6  . omeprazole (PRILOSEC) 40 MG capsule TAKE 1 CAPSULE EVERY DAY 90 capsule 3   No current facility-administered medications on file prior to visit.   Allergies  Allergen Reactions  . Codeine Nausea And Vomiting   History   Social History  . Marital Status: Married    Spouse Name: N/A    Number of Children: N/A  . Years of Education: N/A   Occupational History  . Not on file.   Social History Main Topics  . Smoking status: Never Smoker   . Smokeless tobacco: Never Used  . Alcohol Use: Yes     Comment: Rare  . Drug Use: No  . Sexual Activity: Not on file   Other Topics Concern  . Not on file   Social History Narrative   Married. Retired.    Never smoked.    Family History  Problem  Relation Age of Onset  . Cancer Father     kidney  . Stroke Brother   . Cancer Brother     Thyroid Cancer  . Heart disease Brother       Review of Systems  All other systems reviewed and are negative.      Objective:   Physical Exam  Constitutional: She is oriented to person, place, and time. She appears well-developed and well-nourished. No distress.  HENT:  Head: Normocephalic and atraumatic.  Right Ear: External ear normal.  Left Ear: External ear normal.  Nose: Nose normal.  Mouth/Throat: Oropharynx is clear and moist. No oropharyngeal exudate.  Eyes: Conjunctivae and EOM are normal. Pupils are equal, round, and reactive to light. Right eye exhibits no discharge. Left eye exhibits no discharge. No scleral icterus.  Neck: Normal range of motion. Neck supple. No JVD present. No tracheal deviation present. No thyromegaly present.  Cardiovascular: Normal rate, regular rhythm, normal heart sounds and intact distal pulses.  Exam reveals no gallop and no friction rub.   No murmur heard. Pulmonary/Chest: Effort normal and breath sounds normal. No stridor. No respiratory distress. She has no wheezes. She has no rales. She exhibits no tenderness.  Abdominal: Soft. Bowel sounds are normal. She exhibits no distension and no mass. There is no tenderness. There is no rebound and no guarding.  Musculoskeletal: Normal range of motion. She exhibits no edema or tenderness.  Lymphadenopathy:    She has no cervical adenopathy.  Neurological: She is alert and oriented to person, place, and time. She has normal reflexes. She displays normal reflexes. No cranial nerve deficit. She exhibits normal muscle tone. Coordination normal.  Skin: Skin is warm. No rash noted. She is not diaphoretic. No erythema. No pallor.  Psychiatric: She has a normal mood and affect. Her behavior is normal. Judgment and thought content normal.  Vitals reviewed.         Assessment & Plan:  Routine general medical  examination  at a health care facility  Patient's physical exam today is completely normal. I will schedule the patient for mammogram. I will schedule her for a bone density test. Patient received her flu shot as well as Pneumovax 23 today in clinic. She is due for Prevnar next year.Marland Kitchen Her Pap smear is up-to-date as it was performed last year. She is not due for repeat Pap smear until 2017. Regular anticipatory guidance was provided.

## 2013-12-21 ENCOUNTER — Other Ambulatory Visit: Payer: Self-pay | Admitting: Family Medicine

## 2013-12-21 DIAGNOSIS — Z1231 Encounter for screening mammogram for malignant neoplasm of breast: Secondary | ICD-10-CM

## 2013-12-21 DIAGNOSIS — E559 Vitamin D deficiency, unspecified: Secondary | ICD-10-CM

## 2013-12-24 ENCOUNTER — Other Ambulatory Visit: Payer: Self-pay | Admitting: Physician Assistant

## 2013-12-24 DIAGNOSIS — Z1231 Encounter for screening mammogram for malignant neoplasm of breast: Secondary | ICD-10-CM

## 2013-12-24 DIAGNOSIS — E559 Vitamin D deficiency, unspecified: Secondary | ICD-10-CM

## 2014-01-14 ENCOUNTER — Ambulatory Visit: Payer: BC Managed Care – PPO

## 2014-01-14 ENCOUNTER — Other Ambulatory Visit: Payer: BC Managed Care – PPO

## 2014-01-16 ENCOUNTER — Other Ambulatory Visit: Payer: Self-pay | Admitting: Physician Assistant

## 2014-01-16 NOTE — Telephone Encounter (Signed)
Medication refilled per protocol. 

## 2014-01-17 DIAGNOSIS — Z780 Asymptomatic menopausal state: Secondary | ICD-10-CM | POA: Diagnosis not present

## 2014-01-17 DIAGNOSIS — Z1231 Encounter for screening mammogram for malignant neoplasm of breast: Secondary | ICD-10-CM | POA: Diagnosis not present

## 2014-02-07 ENCOUNTER — Encounter: Payer: Self-pay | Admitting: Family Medicine

## 2014-02-13 ENCOUNTER — Other Ambulatory Visit: Payer: Self-pay | Admitting: Family Medicine

## 2014-04-24 DIAGNOSIS — D1801 Hemangioma of skin and subcutaneous tissue: Secondary | ICD-10-CM | POA: Diagnosis not present

## 2014-04-24 DIAGNOSIS — L814 Other melanin hyperpigmentation: Secondary | ICD-10-CM | POA: Diagnosis not present

## 2014-04-24 DIAGNOSIS — L570 Actinic keratosis: Secondary | ICD-10-CM | POA: Diagnosis not present

## 2014-04-24 DIAGNOSIS — D225 Melanocytic nevi of trunk: Secondary | ICD-10-CM | POA: Diagnosis not present

## 2014-04-24 DIAGNOSIS — Z85828 Personal history of other malignant neoplasm of skin: Secondary | ICD-10-CM | POA: Diagnosis not present

## 2014-04-25 ENCOUNTER — Other Ambulatory Visit: Payer: Self-pay | Admitting: Family Medicine

## 2014-04-25 NOTE — Telephone Encounter (Signed)
Medication refilled per protocol. 

## 2014-08-16 ENCOUNTER — Other Ambulatory Visit: Payer: Self-pay | Admitting: Physician Assistant

## 2014-08-16 ENCOUNTER — Ambulatory Visit (INDEPENDENT_AMBULATORY_CARE_PROVIDER_SITE_OTHER): Payer: Medicare Other | Admitting: Family Medicine

## 2014-08-16 ENCOUNTER — Encounter: Payer: Self-pay | Admitting: Family Medicine

## 2014-08-16 VITALS — BP 124/72 | HR 78 | Temp 98.3°F | Resp 16 | Ht 66.0 in | Wt 213.0 lb

## 2014-08-16 DIAGNOSIS — R0789 Other chest pain: Secondary | ICD-10-CM

## 2014-08-16 DIAGNOSIS — R609 Edema, unspecified: Secondary | ICD-10-CM

## 2014-08-16 LAB — URINALYSIS, MICROSCOPIC ONLY
Casts: NONE SEEN [LPF]
Crystals: NONE SEEN [HPF]
RBC / HPF: NONE SEEN RBC/HPF (ref <=2)
Yeast: NONE SEEN [HPF]

## 2014-08-16 LAB — COMPLETE METABOLIC PANEL WITH GFR
ALBUMIN: 3.8 g/dL (ref 3.6–5.1)
ALK PHOS: 74 U/L (ref 33–130)
ALT: 17 U/L (ref 6–29)
AST: 19 U/L (ref 10–35)
BUN: 15 mg/dL (ref 7–25)
CALCIUM: 10 mg/dL (ref 8.6–10.4)
CHLORIDE: 103 mmol/L (ref 98–110)
CO2: 26 mmol/L (ref 20–31)
Creat: 0.82 mg/dL (ref 0.50–0.99)
GFR, Est African American: 86 mL/min (ref >=60)
GFR, Est Non African American: 75 mL/min (ref >=60)
Glucose, Bld: 90 mg/dL (ref 70–99)
POTASSIUM: 3.8 mmol/L (ref 3.5–5.3)
SODIUM: 140 mmol/L (ref 135–146)
Total Bilirubin: 0.4 mg/dL (ref 0.2–1.2)
Total Protein: 6.8 g/dL (ref 6.1–8.1)

## 2014-08-16 LAB — CBC WITH DIFFERENTIAL/PLATELET
Basophils Absolute: 0 10*3/uL (ref 0.0–0.1)
Basophils Relative: 0 % (ref 0–1)
EOS ABS: 0.2 10*3/uL (ref 0.0–0.7)
Eosinophils Relative: 3 % (ref 0–5)
HEMATOCRIT: 38.4 % (ref 36.0–46.0)
Hemoglobin: 13.1 g/dL (ref 12.0–15.0)
LYMPHS ABS: 2.7 10*3/uL (ref 0.7–4.0)
LYMPHS PCT: 34 % (ref 12–46)
MCH: 29.6 pg (ref 26.0–34.0)
MCHC: 34.1 g/dL (ref 30.0–36.0)
MCV: 86.7 fL (ref 78.0–100.0)
MONO ABS: 0.5 10*3/uL (ref 0.1–1.0)
MONOS PCT: 6 % (ref 3–12)
MPV: 11.6 fL (ref 8.6–12.4)
NEUTROS ABS: 4.6 10*3/uL (ref 1.7–7.7)
Neutrophils Relative %: 57 % (ref 43–77)
Platelets: 245 10*3/uL (ref 150–400)
RBC: 4.43 MIL/uL (ref 3.87–5.11)
RDW: 13.7 % (ref 11.5–15.5)
WBC: 8 10*3/uL (ref 4.0–10.5)

## 2014-08-16 LAB — URINALYSIS, ROUTINE W REFLEX MICROSCOPIC
Bilirubin Urine: NEGATIVE
Glucose, UA: NEGATIVE
KETONES UR: NEGATIVE
NITRITE: NEGATIVE
PROTEIN: NEGATIVE
Specific Gravity, Urine: 1.015 (ref 1.001–1.035)
pH: 6 (ref 5.0–8.0)

## 2014-08-16 NOTE — Telephone Encounter (Signed)
Medication refilled per protocol. 

## 2014-08-16 NOTE — Progress Notes (Signed)
Subjective:    Patient ID: Kelli Terrell, female    DOB: 09-Jun-1950, 64 y.o.   MRN: [id number]  HPI Patient is a 64 year old white female who recently discontinued taking her hormone replacement therapy. Since that time she has noticed significant fatigue, and swelling in her legs and ankles and hands. She denies significant dyspnea on exertion. She denies orthopnea. She does report severe fatigue. She denies any significant chest pain or angina although she does have occasional heaviness in the chest which she attributes to heat and difficulty breathing in the heat. She also has been eating a significant amount of salt recently. She is also on amlodipine. She denies any oliguria. She denies any jaundice or itching. There is no scleral icterus. On examination today she does have +1 bipedal edema. There is no JVD. She has no hepatojugular reflux.  Past Medical History  Diagnosis Date  . Hyperlipidemia   . Hypertension   . GERD (gastroesophageal reflux disease)    Past Surgical History  Procedure Laterality Date  . Cholecystectomy    . Tonsillectomy     Current Outpatient Prescriptions on File Prior to Visit  Medication Sig Dispense Refill  . amLODipine (NORVASC) 5 MG tablet TAKE 1 TABLET (5 MG TOTAL) BY MOUTH DAILY. 90 tablet 3  . aspirin 81 MG tablet Take 81 mg by mouth daily.    . B Complex CAPS Take by mouth.    . Biotin 1 MG CAPS Take by mouth.    . Cholecalciferol (VITAMIN D) 2000 UNITS CAPS Take by mouth.    . fluoruracil (CARAC) 0.5 % cream Apply topically daily. 30 g 0  . hydrochlorothiazide (HYDRODIURIL) 25 MG tablet Take 1 tablet (25 mg total) by mouth daily. 90 tablet 3  . MIMVEY 1-0.5 MG per tablet TAKE 1 TABLET BY MOUTH DAILY 28 tablet 6  . omeprazole (PRILOSEC) 40 MG capsule TAKE ONE CAPSULE BY MOUTH EVERY DAY 90 capsule 1   No current facility-administered medications on file prior to visit.   Allergies  Allergen Reactions  . Codeine Nausea And Vomiting   History    Social History  . Marital Status: Married    Spouse Name: N/A  . Number of Children: N/A  . Years of Education: N/A   Occupational History  . Not on file.   Social History Main Topics  . Smoking status: Never Smoker   . Smokeless tobacco: Never Used  . Alcohol Use: Yes     Comment: Rare  . Drug Use: No  . Sexual Activity: Not on file   Other Topics Concern  . Not on file   Social History Narrative   Married. Retired.    Never smoked.       Review of Systems  All other systems reviewed and are negative.      Objective:   Physical Exam  Constitutional: She appears well-developed and well-nourished. No distress.  Neck: Neck supple. No JVD present.  Cardiovascular: Normal rate, regular rhythm and normal heart sounds.  Exam reveals no gallop and no friction rub.   No murmur heard. Pulmonary/Chest: Effort normal and breath sounds normal. No respiratory distress. She has no wheezes. She has no rales. She exhibits no tenderness.  Abdominal: Soft. Bowel sounds are normal. She exhibits no distension. There is no tenderness. There is no rebound and no guarding.  Musculoskeletal: She exhibits edema.  Skin: She is not diaphoretic.  Vitals reviewed.         Assessment & Plan:  Edema - Plan: CBC with Differential/Platelet, COMPLETE METABOLIC PANEL WITH GFR, Brain natriuretic peptide, Urinalysis, Routine w reflex microscopic (not at [facility])  Other chest pain - Plan: EKG 12-Lead  I suspect the swelling the patient is experiencing is due to increased sodium consumption coupled with vasodilation secondary to amlodipine as well as discontinuing her estrogen.  I will check a CBC to rule out anemia, I will check a urinalysis to rule out significant proteinuria, I will check a BNP to evaluate for any evidence of congestive heart failure. I will check a CMP to monitor her renal function. I will also check an EKG.  EKG today shows normal sinus rhythm with normal intervals and a normal  axis and no evidence of ischemia or infarction. I have asked the patient to discontinue her consumption of salt. If the swelling does not improve, I would discontinue hydrochlorothiazide and replace that pill with Lasix 40 mg by mouth daily

## 2014-08-17 LAB — BRAIN NATRIURETIC PEPTIDE: Brain Natriuretic Peptide: 35 pg/mL (ref 0.0–100.0)

## 2014-08-19 ENCOUNTER — Other Ambulatory Visit: Payer: Self-pay | Admitting: Family Medicine

## 2014-08-19 MED ORDER — FUROSEMIDE 40 MG PO TABS: 40.0000 mg | ORAL_TABLET | Freq: Every day | ORAL | Status: DC

## 2014-10-08 DIAGNOSIS — H524 Presbyopia: Secondary | ICD-10-CM | POA: Diagnosis not present

## 2014-11-01 ENCOUNTER — Other Ambulatory Visit (INDEPENDENT_AMBULATORY_CARE_PROVIDER_SITE_OTHER): Payer: Medicare Other | Admitting: Physician Assistant

## 2014-11-01 DIAGNOSIS — Z23 Encounter for immunization: Secondary | ICD-10-CM | POA: Diagnosis not present

## 2014-12-18 ENCOUNTER — Other Ambulatory Visit: Payer: Self-pay | Admitting: Family Medicine

## 2014-12-18 ENCOUNTER — Other Ambulatory Visit: Payer: Medicare Other

## 2014-12-18 DIAGNOSIS — E785 Hyperlipidemia, unspecified: Secondary | ICD-10-CM

## 2014-12-18 DIAGNOSIS — I10 Essential (primary) hypertension: Secondary | ICD-10-CM | POA: Diagnosis not present

## 2014-12-18 DIAGNOSIS — Z0000 Encounter for general adult medical examination without abnormal findings: Secondary | ICD-10-CM | POA: Diagnosis not present

## 2014-12-18 LAB — COMPREHENSIVE METABOLIC PANEL
ALT: 14 U/L (ref 6–29)
AST: 16 U/L (ref 10–35)
Albumin: 4 g/dL (ref 3.6–5.1)
Alkaline Phosphatase: 78 U/L (ref 33–130)
BUN: 15 mg/dL (ref 7–25)
CHLORIDE: 104 mmol/L (ref 98–110)
CO2: 24 mmol/L (ref 20–31)
Calcium: 10.1 mg/dL (ref 8.6–10.4)
Creat: 0.78 mg/dL (ref 0.50–0.99)
GLUCOSE: 95 mg/dL (ref 70–99)
POTASSIUM: 4.3 mmol/L (ref 3.5–5.3)
Sodium: 139 mmol/L (ref 135–146)
Total Bilirubin: 0.4 mg/dL (ref 0.2–1.2)
Total Protein: 6.9 g/dL (ref 6.1–8.1)

## 2014-12-18 LAB — CBC WITH DIFFERENTIAL/PLATELET
Basophils Absolute: 0.1 10*3/uL (ref 0.0–0.1)
Basophils Relative: 1 % (ref 0–1)
Eosinophils Absolute: 0.3 10*3/uL (ref 0.0–0.7)
Eosinophils Relative: 3 % (ref 0–5)
HEMATOCRIT: 38.2 % (ref 36.0–46.0)
HEMOGLOBIN: 13.1 g/dL (ref 12.0–15.0)
LYMPHS ABS: 2.9 10*3/uL (ref 0.7–4.0)
LYMPHS PCT: 34 % (ref 12–46)
MCH: 29.6 pg (ref 26.0–34.0)
MCHC: 34.3 g/dL (ref 30.0–36.0)
MCV: 86.2 fL (ref 78.0–100.0)
MONO ABS: 0.6 10*3/uL (ref 0.1–1.0)
MONOS PCT: 7 % (ref 3–12)
MPV: 11.7 fL (ref 8.6–12.4)
NEUTROS ABS: 4.7 10*3/uL (ref 1.7–7.7)
Neutrophils Relative %: 55 % (ref 43–77)
Platelets: 275 10*3/uL (ref 150–400)
RBC: 4.43 MIL/uL (ref 3.87–5.11)
RDW: 13.8 % (ref 11.5–15.5)
WBC: 8.5 10*3/uL (ref 4.0–10.5)

## 2014-12-18 LAB — LIPID PANEL
Cholesterol: 198 mg/dL (ref 125–200)
HDL: 57 mg/dL (ref >=46)
LDL CALC: 125 mg/dL (ref <130)
TRIGLYCERIDES: 81 mg/dL (ref <150)
Total CHOL/HDL Ratio: 3.5 Ratio (ref <=5.0)
VLDL: 16 mg/dL (ref <30)

## 2014-12-18 LAB — TSH: TSH: 3.076 u[IU]/mL (ref 0.350–4.500)

## 2014-12-26 ENCOUNTER — Ambulatory Visit (INDEPENDENT_AMBULATORY_CARE_PROVIDER_SITE_OTHER): Payer: Medicare Other | Admitting: Family Medicine

## 2014-12-26 ENCOUNTER — Encounter: Payer: Self-pay | Admitting: Family Medicine

## 2014-12-26 VITALS — BP 136/80 | HR 76 | Temp 98.3°F | Resp 18 | Ht 66.0 in | Wt 218.0 lb

## 2014-12-26 DIAGNOSIS — Z0000 Encounter for general adult medical examination without abnormal findings: Secondary | ICD-10-CM | POA: Diagnosis not present

## 2014-12-26 NOTE — Progress Notes (Signed)
Subjective:    Patient ID: Kelli Terrell, female    DOB: 23-Dec-1950, 64 y.o.   MRN: [id number]  HPI Here for CPE.  Last colonoscopy was in 2013.  Mammogram 1/16 and DEXA which was excellent was 1/16.  Pneumovax, Tdap, and influenza shots are up to date.  Prevnar 13 is due.  Zostavax given in 2010. Past Medical History  Diagnosis Date  . Hyperlipidemia   . Hypertension   . GERD (gastroesophageal reflux disease)    Past Surgical History  Procedure Laterality Date  . Cholecystectomy    . Tonsillectomy     Current Outpatient Prescriptions on File Prior to Visit  Medication Sig Dispense Refill  . amLODipine (NORVASC) 5 MG tablet TAKE 1 TABLET (5 MG TOTAL) BY MOUTH DAILY. 90 tablet 3  . aspirin 81 MG tablet Take 81 mg by mouth daily.    . B Complex CAPS Take by mouth.    . Biotin 1 MG CAPS Take by mouth.    . Cholecalciferol (VITAMIN D) 2000 UNITS CAPS Take by mouth.    . fluoruracil (CARAC) 0.5 % cream Apply topically daily. 30 g 0  . MIMVEY 1-0.5 MG per tablet TAKE 1 TABLET BY MOUTH EVERY DAY 28 tablet 5  . omeprazole (PRILOSEC) 40 MG capsule TAKE ONE CAPSULE BY MOUTH EVERY DAY 90 capsule 4   No current facility-administered medications on file prior to visit.   Allergies  Allergen Reactions  . Codeine Nausea And Vomiting   Social History   Social History  . Marital Status: Married    Spouse Name: N/A  . Number of Children: N/A  . Years of Education: N/A   Occupational History  . Not on file.   Social History Main Topics  . Smoking status: Never Smoker   . Smokeless tobacco: Never Used  . Alcohol Use: Yes     Comment: Rare  . Drug Use: No  . Sexual Activity: Not on file   Other Topics Concern  . Not on file   Social History Narrative   Married. Retired.    Never smoked.    Family History  Problem Relation Age of Onset  . Cancer Father     kidney  . Stroke Brother   . Cancer Brother     Thyroid Cancer  . Heart disease Brother      Review of Systems    All other systems reviewed and are negative.      Objective:   Physical Exam  Constitutional: She is oriented to person, place, and time. She appears well-developed and well-nourished. No distress.  HENT:  Head: Normocephalic and atraumatic.  Right Ear: External ear normal.  Left Ear: External ear normal.  Nose: Nose normal.  Mouth/Throat: Oropharynx is clear and moist. No oropharyngeal exudate.  Eyes: Conjunctivae and EOM are normal. Pupils are equal, round, and reactive to light. Right eye exhibits no discharge. Left eye exhibits no discharge. No scleral icterus.  Neck: Normal range of motion. Neck supple. No JVD present. No tracheal deviation present. No thyromegaly present.  Cardiovascular: Normal rate, regular rhythm, normal heart sounds and intact distal pulses.  Exam reveals no gallop and no friction rub.   No murmur heard. Pulmonary/Chest: Effort normal and breath sounds normal. No stridor. No respiratory distress. She has no wheezes. She has no rales. She exhibits no tenderness.  Abdominal: Soft. Bowel sounds are normal. She exhibits no distension and no mass. There is no tenderness. There is no rebound and no  guarding.  Musculoskeletal: Normal range of motion. She exhibits no edema or tenderness.  Lymphadenopathy:    She has no cervical adenopathy.  Neurological: She is alert and oriented to person, place, and time. She has normal reflexes. She displays normal reflexes. No cranial nerve deficit. She exhibits normal muscle tone. Coordination normal.  Skin: Skin is warm. No rash noted. She is not diaphoretic. No erythema. No pallor.  Psychiatric: She has a normal mood and affect. Her behavior is normal. Judgment and thought content normal.  Vitals reviewed.         Assessment & Plan:  Routine general medical examination at a health care facility  Cancer screening is up to date.  Received prevnar 13 today.  Declined hep C screening due to lack of associated risk factors.   Labs are excellent. Lab on 12/18/2014  Component Date Value Ref Range Status  . Sodium 12/18/2014 139  135 - 146 mmol/L Final  . Potassium 12/18/2014 4.3  3.5 - 5.3 mmol/L Final  . Chloride 12/18/2014 104  98 - 110 mmol/L Final  . CO2 12/18/2014 24  20 - 31 mmol/L Final  . Glucose, Bld 12/18/2014 95  70 - 99 mg/dL Final  . BUN 12/18/2014 15  7 - 25 mg/dL Final  . Creat 12/18/2014 0.78  0.50 - 0.99 mg/dL Final  . Total Bilirubin 12/18/2014 0.4  0.2 - 1.2 mg/dL Final  . Alkaline Phosphatase 12/18/2014 78  33 - 130 U/L Final  . AST 12/18/2014 16  10 - 35 U/L Final  . ALT 12/18/2014 14  6 - 29 U/L Final  . Total Protein 12/18/2014 6.9  6.1 - 8.1 g/dL Final  . Albumin 12/18/2014 4.0  3.6 - 5.1 g/dL Final  . Calcium 12/18/2014 10.1  8.6 - 10.4 mg/dL Final  . WBC 12/18/2014 8.5  4.0 - 10.5 K/uL Final  . RBC 12/18/2014 4.43  3.87 - 5.11 MIL/uL Final  . Hemoglobin 12/18/2014 13.1  12.0 - 15.0 g/dL Final  . HCT 12/18/2014 38.2  36.0 - 46.0 % Final  . MCV 12/18/2014 86.2  78.0 - 100.0 fL Final  . MCH 12/18/2014 29.6  26.0 - 34.0 pg Final  . MCHC 12/18/2014 34.3  30.0 - 36.0 g/dL Final  . RDW 12/18/2014 13.8  11.5 - 15.5 % Final  . Platelets 12/18/2014 275  150 - 400 K/uL Final  . MPV 12/18/2014 11.7  8.6 - 12.4 fL Final  . Neutrophils Relative % 12/18/2014 55  43 - 77 % Final  . Neutro Abs 12/18/2014 4.7  1.7 - 7.7 K/uL Final  . Lymphocytes Relative 12/18/2014 34  12 - 46 % Final  . Lymphs Abs 12/18/2014 2.9  0.7 - 4.0 K/uL Final  . Monocytes Relative 12/18/2014 7  3 - 12 % Final  . Monocytes Absolute 12/18/2014 0.6  0.1 - 1.0 K/uL Final  . Eosinophils Relative 12/18/2014 3  0 - 5 % Final  . Eosinophils Absolute 12/18/2014 0.3  0.0 - 0.7 K/uL Final  . Basophils Relative 12/18/2014 1  0 - 1 % Final  . Basophils Absolute 12/18/2014 0.1  0.0 - 0.1 K/uL Final  . Smear Review 12/18/2014 Criteria for review not met   Final  . Cholesterol 12/18/2014 198  125 - 200 mg/dL Final  . Triglycerides  12/18/2014 81  <150 mg/dL Final  . HDL 12/18/2014 57  >=46 mg/dL Final  . Total CHOL/HDL Ratio 12/18/2014 3.5  <=5.0 Ratio Final  . VLDL 12/18/2014 16  <  30 mg/dL Final  . LDL Cholesterol 12/18/2014 125  <130 mg/dL Final   Comment:   Total Cholesterol/HDL Ratio:CHD Risk                        Coronary Heart Disease Risk Table                                        Men       Women          1/2 Average Risk              3.4        3.3              Average Risk              5.0        4.4           2X Average Risk              9.6        7.1           3X Average Risk             23.4       11.0 Use the calculated Patient Ratio above and the CHD Risk table  to determine the patient's CHD Risk.   Marland Kitchen TSH 12/18/2014 3.076  0.350 - 4.500 uIU/mL Final   Immunizations are up todate. Regular anticipatory guidance is provided.

## 2015-01-05 ENCOUNTER — Other Ambulatory Visit: Payer: Self-pay | Admitting: Family Medicine

## 2015-01-23 DIAGNOSIS — Z1231 Encounter for screening mammogram for malignant neoplasm of breast: Secondary | ICD-10-CM | POA: Diagnosis not present

## 2015-01-23 LAB — HM MAMMOGRAPHY: HM MAMMO: NEGATIVE

## 2015-01-27 ENCOUNTER — Encounter: Payer: Self-pay | Admitting: Family Medicine

## 2015-03-19 ENCOUNTER — Telehealth: Payer: Self-pay | Admitting: Family Medicine

## 2015-03-19 DIAGNOSIS — Z1211 Encounter for screening for malignant neoplasm of colon: Secondary | ICD-10-CM

## 2015-03-19 NOTE — Telephone Encounter (Signed)
Pt aware and stool hemacues left up front

## 2015-03-19 NOTE — Telephone Encounter (Signed)
-----   Message from Susy Frizzle, MD sent at 02/24/2015  7:18 AM EST ----- Blood was found in her stool, repeat x 3 through our office.  Last colonoscopy was in 2013.  Make sure she is not on iron when she performs stool tests.

## 2015-03-25 ENCOUNTER — Other Ambulatory Visit: Payer: Medicare Other

## 2015-03-25 DIAGNOSIS — Z1211 Encounter for screening for malignant neoplasm of colon: Secondary | ICD-10-CM | POA: Diagnosis not present

## 2015-03-26 LAB — FECAL OCCULT BLOOD, IMMUNOCHEMICAL
FECAL OCCULT BLOOD: NEGATIVE
Fecal Occult Blood: NEGATIVE
Fecal Occult Blood: NEGATIVE

## 2015-03-28 ENCOUNTER — Encounter: Payer: Self-pay | Admitting: Family Medicine

## 2015-04-14 ENCOUNTER — Other Ambulatory Visit: Payer: Self-pay | Admitting: Family Medicine

## 2015-04-14 NOTE — Telephone Encounter (Signed)
Refill appropriate and filled per protocol. 

## 2015-04-28 DIAGNOSIS — D2262 Melanocytic nevi of left upper limb, including shoulder: Secondary | ICD-10-CM | POA: Diagnosis not present

## 2015-04-28 DIAGNOSIS — D2261 Melanocytic nevi of right upper limb, including shoulder: Secondary | ICD-10-CM | POA: Diagnosis not present

## 2015-04-28 DIAGNOSIS — D485 Neoplasm of uncertain behavior of skin: Secondary | ICD-10-CM | POA: Diagnosis not present

## 2015-04-28 DIAGNOSIS — Z85828 Personal history of other malignant neoplasm of skin: Secondary | ICD-10-CM | POA: Diagnosis not present

## 2015-04-28 DIAGNOSIS — L570 Actinic keratosis: Secondary | ICD-10-CM | POA: Diagnosis not present

## 2015-04-28 DIAGNOSIS — L821 Other seborrheic keratosis: Secondary | ICD-10-CM | POA: Diagnosis not present

## 2015-04-28 DIAGNOSIS — L814 Other melanin hyperpigmentation: Secondary | ICD-10-CM | POA: Diagnosis not present

## 2015-08-01 ENCOUNTER — Other Ambulatory Visit: Payer: Self-pay | Admitting: Family Medicine

## 2015-10-09 DIAGNOSIS — H524 Presbyopia: Secondary | ICD-10-CM | POA: Diagnosis not present

## 2015-12-31 ENCOUNTER — Other Ambulatory Visit: Payer: Self-pay | Admitting: Family Medicine

## 2015-12-31 DIAGNOSIS — I10 Essential (primary) hypertension: Secondary | ICD-10-CM

## 2015-12-31 DIAGNOSIS — Z79899 Other long term (current) drug therapy: Secondary | ICD-10-CM

## 2015-12-31 DIAGNOSIS — Z79890 Hormone replacement therapy (postmenopausal): Secondary | ICD-10-CM

## 2015-12-31 DIAGNOSIS — Z0000 Encounter for general adult medical examination without abnormal findings: Secondary | ICD-10-CM

## 2015-12-31 DIAGNOSIS — E785 Hyperlipidemia, unspecified: Secondary | ICD-10-CM

## 2016-01-01 ENCOUNTER — Other Ambulatory Visit: Payer: Self-pay | Admitting: Family Medicine

## 2016-01-02 ENCOUNTER — Other Ambulatory Visit: Payer: Medicare Other

## 2016-01-02 DIAGNOSIS — E785 Hyperlipidemia, unspecified: Secondary | ICD-10-CM | POA: Diagnosis not present

## 2016-01-02 DIAGNOSIS — I10 Essential (primary) hypertension: Secondary | ICD-10-CM

## 2016-01-02 DIAGNOSIS — Z0000 Encounter for general adult medical examination without abnormal findings: Secondary | ICD-10-CM

## 2016-01-02 DIAGNOSIS — Z79899 Other long term (current) drug therapy: Secondary | ICD-10-CM | POA: Diagnosis not present

## 2016-01-02 DIAGNOSIS — Z79890 Hormone replacement therapy (postmenopausal): Secondary | ICD-10-CM | POA: Diagnosis not present

## 2016-01-02 DIAGNOSIS — E559 Vitamin D deficiency, unspecified: Secondary | ICD-10-CM | POA: Diagnosis not present

## 2016-01-02 LAB — LIPID PANEL
CHOLESTEROL: 197 mg/dL (ref <200)
HDL: 58 mg/dL (ref >50)
LDL Cholesterol: 119 mg/dL — ABNORMAL HIGH (ref <100)
Total CHOL/HDL Ratio: 3.4 Ratio (ref <5.0)
Triglycerides: 99 mg/dL (ref <150)
VLDL: 20 mg/dL (ref <30)

## 2016-01-02 LAB — CBC WITH DIFFERENTIAL/PLATELET
Basophils Absolute: 77 cells/uL (ref 0–200)
Basophils Relative: 1 %
EOS ABS: 231 {cells}/uL (ref 15–500)
Eosinophils Relative: 3 %
HEMATOCRIT: 40.1 % (ref 35.0–45.0)
HEMOGLOBIN: 13 g/dL (ref 12.0–15.0)
Lymphocytes Relative: 36 %
Lymphs Abs: 2772 cells/uL (ref 850–3900)
MCH: 29 pg (ref 27.0–33.0)
MCHC: 32.4 g/dL (ref 32.0–36.0)
MCV: 89.5 fL (ref 80.0–100.0)
MONOS PCT: 7 %
MPV: 11.7 fL (ref 7.5–12.5)
Monocytes Absolute: 539 cells/uL (ref 200–950)
NEUTROS ABS: 4081 {cells}/uL (ref 1500–7800)
Neutrophils Relative %: 53 %
PLATELETS: 286 10*3/uL (ref 140–400)
RBC: 4.48 MIL/uL (ref 3.80–5.10)
RDW: 13.3 % (ref 11.0–15.0)
WBC: 7.7 10*3/uL (ref 3.8–10.8)

## 2016-01-02 LAB — COMPLETE METABOLIC PANEL WITH GFR
ALT: 17 U/L (ref 6–29)
AST: 22 U/L (ref 10–35)
Albumin: 3.7 g/dL (ref 3.6–5.1)
Alkaline Phosphatase: 63 U/L (ref 33–130)
BILIRUBIN TOTAL: 0.4 mg/dL (ref 0.2–1.2)
BUN: 17 mg/dL (ref 7–25)
CHLORIDE: 105 mmol/L (ref 98–110)
CO2: 26 mmol/L (ref 20–31)
Calcium: 9.8 mg/dL (ref 8.6–10.4)
Creat: 0.8 mg/dL (ref 0.50–0.99)
GFR, EST AFRICAN AMERICAN: 88 mL/min (ref >=60)
GFR, EST NON AFRICAN AMERICAN: 77 mL/min (ref >=60)
Glucose, Bld: 87 mg/dL (ref 70–99)
Potassium: 4.3 mmol/L (ref 3.5–5.3)
Sodium: 141 mmol/L (ref 135–146)
TOTAL PROTEIN: 6.8 g/dL (ref 6.1–8.1)

## 2016-01-02 LAB — TSH: TSH: 3.14 mIU/L

## 2016-01-03 LAB — VITAMIN D 25 HYDROXY (VIT D DEFICIENCY, FRACTURES): Vit D, 25-Hydroxy: 61 ng/mL (ref 30–100)

## 2016-01-08 ENCOUNTER — Encounter: Payer: Medicare Other | Admitting: Family Medicine

## 2016-01-08 ENCOUNTER — Ambulatory Visit (INDEPENDENT_AMBULATORY_CARE_PROVIDER_SITE_OTHER): Payer: Medicare Other | Admitting: Family Medicine

## 2016-01-08 ENCOUNTER — Encounter: Payer: Self-pay | Admitting: Family Medicine

## 2016-01-08 VITALS — BP 136/90 | HR 78 | Temp 98.8°F | Resp 16 | Ht 66.0 in | Wt 220.0 lb

## 2016-01-08 DIAGNOSIS — Z0000 Encounter for general adult medical examination without abnormal findings: Secondary | ICD-10-CM

## 2016-01-08 DIAGNOSIS — Z23 Encounter for immunization: Secondary | ICD-10-CM

## 2016-01-08 NOTE — Addendum Note (Signed)
Addended by: Shary Decamp B on: 01/08/2016 10:21 AM   Modules accepted: Orders

## 2016-01-08 NOTE — Progress Notes (Signed)
Subjective:    Patient ID: Kelli Terrell, female    DOB: [birth year], 65 y.o.   MRN: [id number]  HPI Here for CPE.  Last colonoscopy was in 2013.  Mammogram 1/17 and DEXA which was excellent was 1/16.  Pneumovax, Tdap, and influenza shots are up to date.  Prevnar 13 is also UTD.  Zostavax given in 2010.He does report some neuropathic pain in the plantar aspects of both feet. This is mild. She also has some metatarsalgia at the second third and fourth metatarsal heads bilaterally. Examination of the foot today is completely normal. Appointment on 01/02/2016  Component Date Value Ref Range Status  . Sodium 01/02/2016 141  135 - 146 mmol/L Final  . Potassium 01/02/2016 4.3  3.5 - 5.3 mmol/L Final  . Chloride 01/02/2016 105  98 - 110 mmol/L Final  . CO2 01/02/2016 26  20 - 31 mmol/L Final  . Glucose, Bld 01/02/2016 87  70 - 99 mg/dL Final  . BUN 01/02/2016 17  7 - 25 mg/dL Final  . Creat 01/02/2016 0.80  0.50 - 0.99 mg/dL Final   Comment:   For patients > or = 67 years of age: The upper reference limit for Creatinine is approximately 13% higher for people identified as African-American.     . Total Bilirubin 01/02/2016 0.4  0.2 - 1.2 mg/dL Final  . Alkaline Phosphatase 01/02/2016 63  33 - 130 U/L Final  . AST 01/02/2016 22  10 - 35 U/L Final  . ALT 01/02/2016 17  6 - 29 U/L Final  . Total Protein 01/02/2016 6.8  6.1 - 8.1 g/dL Final  . Albumin 01/02/2016 3.7  3.6 - 5.1 g/dL Final  . Calcium 01/02/2016 9.8  8.6 - 10.4 mg/dL Final  . GFR, Est African American 01/02/2016 88  >=60 mL/min Final  . GFR, Est Non African American 01/02/2016 77  >=60 mL/min Final  . TSH 01/02/2016 3.14  mIU/L Final   Comment:   Reference Range   > or = 20 Years  0.40-4.50   Pregnancy Range First trimester  0.26-2.66 Second trimester 0.55-2.73 Third trimester  0.43-2.91     . Cholesterol 01/02/2016 197  <200 mg/dL Final  . Triglycerides 01/02/2016 99  <150 mg/dL Final  . HDL 01/02/2016 58  >50 mg/dL  Final  . Total CHOL/HDL Ratio 01/02/2016 3.4  <5.0 Ratio Final  . VLDL 01/02/2016 20  <30 mg/dL Final  . LDL Cholesterol 01/02/2016 119* <100 mg/dL Final  . WBC 01/02/2016 7.7  3.8 - 10.8 K/uL Final  . RBC 01/02/2016 4.48  3.80 - 5.10 MIL/uL Final  . Hemoglobin 01/02/2016 13.0  12.0 - 15.0 g/dL Final  . HCT 01/02/2016 40.1  35.0 - 45.0 % Final  . MCV 01/02/2016 89.5  80.0 - 100.0 fL Final  . MCH 01/02/2016 29.0  27.0 - 33.0 pg Final  . MCHC 01/02/2016 32.4  32.0 - 36.0 g/dL Final  . RDW 01/02/2016 13.3  11.0 - 15.0 % Final  . Platelets 01/02/2016 286  140 - 400 K/uL Final  . MPV 01/02/2016 11.7  7.5 - 12.5 fL Final  . Neutro Abs 01/02/2016 4081  1,500 - 7,800 cells/uL Final  . Lymphs Abs 01/02/2016 2772  850 - 3,900 cells/uL Final  . Monocytes Absolute 01/02/2016 539  200 - 950 cells/uL Final  . Eosinophils Absolute 01/02/2016 231  15 - 500 cells/uL Final  . Basophils Absolute 01/02/2016 77  0 - 200 cells/uL Final  . Neutrophils Relative %  01/02/2016 53  % Final  . Lymphocytes Relative 01/02/2016 36  % Final  . Monocytes Relative 01/02/2016 7  % Final  . Eosinophils Relative 01/02/2016 3  % Final  . Basophils Relative 01/02/2016 1  % Final  . Smear Review 01/02/2016 Criteria for review not met   Final  . Vit D, 25-Hydroxy 01/03/2016 61  30 - 100 ng/mL Final   Comment: Vitamin D Status           25-OH Vitamin D        Deficiency                <20 ng/mL        Insufficiency         20 - 29 ng/mL        Optimal             > or = 30 ng/mL   For 25-OH Vitamin D testing on patients on D2-supplementation and patients for whom quantitation of D2 and D3 fractions is required, the [facility] 25-OH VIT D, (D2,D3), LC/MS/MS is recommended: order code [id number] (patients > 2 yrs).     Past Medical History:  Diagnosis Date  . GERD (gastroesophageal reflux disease)   . Hyperlipidemia   . Hypertension    Past Surgical History:  Procedure Laterality Date  . CHOLECYSTECTOMY    .  TONSILLECTOMY     Current Outpatient Prescriptions on File Prior to Visit  Medication Sig Dispense Refill  . amLODipine (NORVASC) 5 MG tablet TAKE 1 TABLET (5 MG TOTAL) BY MOUTH DAILY. 90 tablet 3  . aspirin 81 MG tablet Take 81 mg by mouth daily.    . B Complex CAPS Take by mouth.    . Biotin 1 MG CAPS Take by mouth.    . Cholecalciferol (VITAMIN D) 2000 UNITS CAPS Take by mouth.    . fluoruracil (CARAC) 0.5 % cream Apply topically daily. 30 g 0  . hydrochlorothiazide (HYDRODIURIL) 25 MG tablet Take 25 mg by mouth daily.    . hydrochlorothiazide (HYDRODIURIL) 25 MG tablet TAKE 1 TABLET BY MOUTH EVERY DAY 90 tablet 3  . MIMVEY 1-0.5 MG tablet TAKE 1 TABLET BY MOUTH EVERY DAY 28 tablet 5  . omeprazole (PRILOSEC) 40 MG capsule TAKE ONE CAPSULE BY MOUTH EVERY DAY (Patient taking differently: TAKE ONE CAPSULE qod) 90 capsule 4   No current facility-administered medications on file prior to visit.    Allergies  Allergen Reactions  . Codeine Nausea And Vomiting   Social History   Social History  . Marital status: Married    Spouse name: N/A  . Number of children: N/A  . Years of education: N/A   Occupational History  . Not on file.   Social History Main Topics  . Smoking status: Never Smoker  . Smokeless tobacco: Never Used  . Alcohol use Yes     Comment: Rare  . Drug use: No  . Sexual activity: Not on file   Other Topics Concern  . Not on file   Social History Narrative   Married. Retired.    Never smoked.    Family History  Problem Relation Age of Onset  . Cancer Father     kidney  . Stroke Brother   . Cancer Brother     Thyroid Cancer  . Heart disease Brother      Review of Systems  All other systems reviewed and are negative.      Objective:  Physical Exam  Constitutional: She is oriented to person, place, and time. She appears well-developed and well-nourished. No distress.  HENT:  Head: Normocephalic and atraumatic.  Right Ear: External ear normal.   Left Ear: External ear normal.  Nose: Nose normal.  Mouth/Throat: Oropharynx is clear and moist. No oropharyngeal exudate.  Eyes: Conjunctivae and EOM are normal. Pupils are equal, round, and reactive to light. Right eye exhibits no discharge. Left eye exhibits no discharge. No scleral icterus.  Neck: Normal range of motion. Neck supple. No JVD present. No tracheal deviation present. No thyromegaly present.  Cardiovascular: Normal rate, regular rhythm, normal heart sounds and intact distal pulses.  Exam reveals no gallop and no friction rub.   No murmur heard. Pulmonary/Chest: Effort normal and breath sounds normal. No stridor. No respiratory distress. She has no wheezes. She has no rales. She exhibits no tenderness.  Abdominal: Soft. Bowel sounds are normal. She exhibits no distension and no mass. There is no tenderness. There is no rebound and no guarding.  Musculoskeletal: Normal range of motion. She exhibits no edema or tenderness.  Lymphadenopathy:    She has no cervical adenopathy.  Neurological: She is alert and oriented to person, place, and time. She has normal reflexes. No cranial nerve deficit. She exhibits normal muscle tone. Coordination normal.  Skin: Skin is warm. No rash noted. She is not diaphoretic. No erythema. No pallor.  Psychiatric: She has a normal mood and affect. Her behavior is normal. Judgment and thought content normal.  Vitals reviewed.         Assessment & Plan:  Routine general medical examination at a health care facility  The patient's cancer screening is up-to-date. She no longer requires Pap smear. Colonoscopy is not due until 2023. Mammogram is due in January. Patient received her flu shot today. All other immunizations are up-to-date. Her lab work is excellent. She declines gabapentin for neuropathic pain. I did recommend orthotic insoles to treat the metatarsalgia in her right foot.

## 2016-01-26 DIAGNOSIS — Z1231 Encounter for screening mammogram for malignant neoplasm of breast: Secondary | ICD-10-CM | POA: Diagnosis not present

## 2016-01-26 LAB — HM MAMMOGRAPHY

## 2016-02-12 ENCOUNTER — Encounter: Payer: Self-pay | Admitting: Family Medicine

## 2016-03-03 ENCOUNTER — Other Ambulatory Visit: Payer: Self-pay | Admitting: Family Medicine

## 2016-04-04 ENCOUNTER — Other Ambulatory Visit: Payer: Self-pay | Admitting: Physician Assistant

## 2016-04-05 NOTE — Telephone Encounter (Signed)
Refill appropriate 

## 2016-04-27 DIAGNOSIS — D2262 Melanocytic nevi of left upper limb, including shoulder: Secondary | ICD-10-CM | POA: Diagnosis not present

## 2016-04-27 DIAGNOSIS — L821 Other seborrheic keratosis: Secondary | ICD-10-CM | POA: Diagnosis not present

## 2016-04-27 DIAGNOSIS — D2261 Melanocytic nevi of right upper limb, including shoulder: Secondary | ICD-10-CM | POA: Diagnosis not present

## 2016-04-27 DIAGNOSIS — Z85828 Personal history of other malignant neoplasm of skin: Secondary | ICD-10-CM | POA: Diagnosis not present

## 2016-04-27 DIAGNOSIS — L570 Actinic keratosis: Secondary | ICD-10-CM | POA: Diagnosis not present

## 2016-04-27 DIAGNOSIS — D1801 Hemangioma of skin and subcutaneous tissue: Secondary | ICD-10-CM | POA: Diagnosis not present

## 2016-05-11 ENCOUNTER — Ambulatory Visit (INDEPENDENT_AMBULATORY_CARE_PROVIDER_SITE_OTHER): Payer: Medicare Other | Admitting: Family Medicine

## 2016-05-11 ENCOUNTER — Encounter: Payer: Self-pay | Admitting: Family Medicine

## 2016-05-11 VITALS — BP 122/74 | HR 68 | Temp 98.2°F | Resp 16 | Ht 66.0 in | Wt 217.0 lb

## 2016-05-11 DIAGNOSIS — M7741 Metatarsalgia, right foot: Secondary | ICD-10-CM

## 2016-05-11 DIAGNOSIS — M7742 Metatarsalgia, left foot: Secondary | ICD-10-CM

## 2016-05-11 DIAGNOSIS — K921 Melena: Secondary | ICD-10-CM

## 2016-05-11 NOTE — Progress Notes (Signed)
Subjective:    Patient ID: Kelli Terrell, female    DOB: 15-Jan-1950, 66 y.o.   MRN: [id number]  HPI For the past 6 months, the patient reports pain in both feet right greater than left. Pain is located at the third and fourth MTP joints. She has a collapsed transverse arch. There is no callus formation at the point of maximum pain. There is also no plantar's wart. Symptoms are consistent with metatarsalgia. She is tried numerous over-the-counter insoles and orthotics and tennis shoes with no relief. Recently she had a stool test performed by her insurance company that returned positive for blood. Her last colonoscopy was normal in 2013. She had stool cards negative 3 last year Past Medical History:  Diagnosis Date  . GERD (gastroesophageal reflux disease)   . Hyperlipidemia   . Hypertension    Past Surgical History:  Procedure Laterality Date  . CHOLECYSTECTOMY    . TONSILLECTOMY     Current Outpatient Prescriptions on File Prior to Visit  Medication Sig Dispense Refill  . amLODipine (NORVASC) 5 MG tablet TAKE 1 TABLET (5 MG TOTAL) BY MOUTH DAILY. 90 tablet 3  . aspirin 81 MG tablet Take 81 mg by mouth daily.    . B Complex CAPS Take by mouth.    . Biotin 1 MG CAPS Take by mouth.    . Cholecalciferol (VITAMIN D) 2000 UNITS CAPS Take by mouth.    . fluoruracil (CARAC) 0.5 % cream Apply topically daily. 30 g 0  . hydrochlorothiazide (HYDRODIURIL) 25 MG tablet Take 25 mg by mouth daily.    . hydrochlorothiazide (HYDRODIURIL) 25 MG tablet TAKE 1 TABLET BY MOUTH EVERY DAY 90 tablet 3  . MIMVEY 1-0.5 MG tablet TAKE 1 TABLET BY MOUTH EVERY DAY 28 tablet 5  . omeprazole (PRILOSEC) 40 MG capsule TAKE ONE CAPSULE BY MOUTH EVERY DAY 90 capsule 3   No current facility-administered medications on file prior to visit.    Allergies  Allergen Reactions  . Codeine Nausea And Vomiting   Social History   Social History  . Marital status: Married    Spouse name: N/A  . Number of children: N/A    . Years of education: N/A   Occupational History  . Not on file.   Social History Main Topics  . Smoking status: Never Smoker  . Smokeless tobacco: Never Used  . Alcohol use Yes     Comment: Rare  . Drug use: No  . Sexual activity: Not on file   Other Topics Concern  . Not on file   Social History Narrative   Married. Retired.    Never smoked.    Family History  Problem Relation Age of Onset  . Cancer Father     kidney  . Stroke Brother   . Cancer Brother     Thyroid Cancer  . Heart disease Brother      Review of Systems  All other systems reviewed and are negative.      Objective:   Physical Exam  Constitutional: She is oriented to person, place, and time. She appears well-developed and well-nourished. No distress.  HENT:  Head: Normocephalic and atraumatic.  Right Ear: External ear normal.  Left Ear: External ear normal.  Nose: Nose normal.  Mouth/Throat: Oropharynx is clear and moist. No oropharyngeal exudate.  Eyes: Conjunctivae and EOM are normal. Pupils are equal, round, and reactive to light. Right eye exhibits no discharge. Left eye exhibits no discharge. No scleral icterus.  Neck:  Normal range of motion. Neck supple. No JVD present. No tracheal deviation present. No thyromegaly present.  Cardiovascular: Normal rate, regular rhythm, normal heart sounds and intact distal pulses.  Exam reveals no gallop and no friction rub.   No murmur heard. Pulmonary/Chest: Effort normal and breath sounds normal. No stridor. No respiratory distress. She has no wheezes. She has no rales. She exhibits no tenderness.  Abdominal: Soft. Bowel sounds are normal. She exhibits no distension and no mass. There is no tenderness. There is no rebound and no guarding.  Musculoskeletal: Normal range of motion. She exhibits no edema or tenderness.  Lymphadenopathy:    She has no cervical adenopathy.  Neurological: She is alert and oriented to person, place, and time. She has normal  reflexes. No cranial nerve deficit. She exhibits normal muscle tone. Coordination normal.  Skin: Skin is warm. No rash noted. She is not diaphoretic. No erythema. No pallor.  Psychiatric: She has a normal mood and affect. Her behavior is normal. Judgment and thought content normal.  Vitals reviewed.         Assessment & Plan:  Blood in stool - Plan: Fecal occult blood, imunochemical, Fecal occult blood, imunochemical, Fecal occult blood, imunochemical  Metatarsalgia of both feet - Plan: Ambulatory referral to [facility] for custom orthotics for her metatarsalgia. Recommended stool cards 3 to confirm or refute the screening test performed by her insurance company

## 2016-05-14 ENCOUNTER — Encounter: Payer: Self-pay | Admitting: Podiatry

## 2016-05-14 ENCOUNTER — Ambulatory Visit (INDEPENDENT_AMBULATORY_CARE_PROVIDER_SITE_OTHER): Payer: Medicare Other | Admitting: Podiatry

## 2016-05-14 ENCOUNTER — Ambulatory Visit (INDEPENDENT_AMBULATORY_CARE_PROVIDER_SITE_OTHER): Payer: Medicare Other

## 2016-05-14 VITALS — BP 130/77 | HR 65 | Resp 16 | Ht 66.0 in | Wt 200.0 lb

## 2016-05-14 DIAGNOSIS — M779 Enthesopathy, unspecified: Secondary | ICD-10-CM

## 2016-05-14 DIAGNOSIS — G629 Polyneuropathy, unspecified: Secondary | ICD-10-CM

## 2016-05-14 DIAGNOSIS — M79673 Pain in unspecified foot: Secondary | ICD-10-CM | POA: Diagnosis not present

## 2016-05-14 MED ORDER — GABAPENTIN 300 MG PO CAPS: 300.0000 mg | ORAL_CAPSULE | Freq: Three times a day (TID) | ORAL | 3 refills | Status: DC

## 2016-05-14 NOTE — Progress Notes (Signed)
   Subjective:    Patient ID: Kelli Terrell, female    DOB: 1950/07/19, 66 y.o.   MRN: [id number]  HPI Chief Complaint  Patient presents with  . Foot Pain    Bilateral; plantar forefoot; pt stated, "Ball of feet feel chapped and now feels like has rocks or pebbles on the ball of the feet"; x6 months      Review of Systems  Cardiovascular: Positive for leg swelling.  Endocrine: Positive for heat intolerance.  Musculoskeletal: Positive for arthralgias and gait problem.  All other systems reviewed and are negative.      Objective:   Physical Exam        Assessment & Plan:

## 2016-05-15 DIAGNOSIS — K921 Melena: Secondary | ICD-10-CM | POA: Diagnosis not present

## 2016-05-15 NOTE — Progress Notes (Signed)
Subjective:    Patient ID: Kelli Terrell, female   DOB: 66 y.o.   MRN: [id number]   HPI patient presents with chronic inflammation of both feet stating that she's had nerve issues and they feel chapped and feels like the rocks are palpable the bottom of her feet but she cannot quite describe it and it's been going on 6 months    Review of Systems  All other systems reviewed and are negative.       Objective:  Physical Exam  Cardiovascular: Intact distal pulses.   Musculoskeletal: Normal range of motion.  Neurological: She is alert.  Skin: Skin is warm and dry.  Nursing note and vitals reviewed.  I noted neurovascular status to be intact with sharp Dole vibratory currently intact and no indications of DTR reflexes loss. Patient's found to have nondescript discomfort and burning-like sensations of the distal metatarsal phalangeal joints bilateral with no indications of acute inflammation noted     Assessment:  Difficult to make complete determination but may be some form of neuropathy or inflammatory metatarsophalangeal joint irritation       Plan:    H&P and conditions reviewed in great detail. At this point we'll start her on low-dose gabapentin and see what this does for her and I did make her a very super softer orthotic to try to reduce stress on her feet  X-rays were negative for signs of fracture negative for signs of advanced arthritis with joint space is congruence and normal

## 2016-05-16 DIAGNOSIS — K921 Melena: Secondary | ICD-10-CM | POA: Diagnosis not present

## 2016-05-20 DIAGNOSIS — K921 Melena: Secondary | ICD-10-CM | POA: Diagnosis not present

## 2016-05-24 ENCOUNTER — Ambulatory Visit: Payer: Medicare Other | Admitting: Family Medicine

## 2016-05-25 ENCOUNTER — Other Ambulatory Visit: Payer: Medicare Other

## 2016-05-27 LAB — FECAL OCCULT BLOOD, IMMUNOCHEMICAL
FECAL OCCULT BLOOD: NEGATIVE
FECAL OCCULT BLOOD: POSITIVE — AB
Fecal Occult Blood: NEGATIVE

## 2016-06-03 ENCOUNTER — Other Ambulatory Visit: Payer: Self-pay | Admitting: Family Medicine

## 2016-06-03 DIAGNOSIS — R195 Other fecal abnormalities: Secondary | ICD-10-CM

## 2016-06-11 ENCOUNTER — Ambulatory Visit (INDEPENDENT_AMBULATORY_CARE_PROVIDER_SITE_OTHER): Payer: Medicare Other | Admitting: Podiatry

## 2016-06-11 DIAGNOSIS — G629 Polyneuropathy, unspecified: Secondary | ICD-10-CM

## 2016-06-11 DIAGNOSIS — M779 Enthesopathy, unspecified: Secondary | ICD-10-CM | POA: Diagnosis not present

## 2016-06-11 NOTE — Patient Instructions (Signed)

## 2016-06-11 NOTE — Progress Notes (Signed)
Subjective:    Patient ID: Kelli Terrell, female   DOB: 66 y.o.   MRN: [id number]   HPI patient states she's not noted a big change with utilization of medication she's here to pickup orthotics and states she's only taking 1 pill a day    ROS      Objective:  Physical Exam neurovascular status unchanged with patient found to have discomfort in the metatarsophalangeal joints bilateral localized in nature with no acute inflammation noted     Assessment:    Difficult to say whether we be dealing with some neuropathic like pain with inflammation noted     Plan:    Orthotics dispensed with instructions and will gradually increase gabapentin to 2 and a than 3 a day

## 2016-07-08 ENCOUNTER — Telehealth: Payer: Self-pay | Admitting: *Deleted

## 2016-07-08 MED ORDER — GABAPENTIN 300 MG PO CAPS: 300.0000 mg | ORAL_CAPSULE | Freq: Three times a day (TID) | ORAL | 3 refills | Status: DC

## 2016-07-08 NOTE — Telephone Encounter (Signed)
Patient requests 90 day fill. Sent new Rx to pharmacy.

## 2016-07-12 DIAGNOSIS — K5730 Diverticulosis of large intestine without perforation or abscess without bleeding: Secondary | ICD-10-CM | POA: Diagnosis not present

## 2016-07-12 DIAGNOSIS — Z1211 Encounter for screening for malignant neoplasm of colon: Secondary | ICD-10-CM | POA: Diagnosis not present

## 2016-07-12 DIAGNOSIS — Q2733 Arteriovenous malformation of digestive system vessel: Secondary | ICD-10-CM | POA: Diagnosis not present

## 2016-07-12 DIAGNOSIS — R195 Other fecal abnormalities: Secondary | ICD-10-CM | POA: Diagnosis not present

## 2016-07-12 DIAGNOSIS — Z86010 Personal history of colonic polyps: Secondary | ICD-10-CM | POA: Diagnosis not present

## 2016-07-12 DIAGNOSIS — K648 Other hemorrhoids: Secondary | ICD-10-CM | POA: Diagnosis not present

## 2016-07-15 ENCOUNTER — Encounter: Payer: Self-pay | Admitting: Family Medicine

## 2016-07-17 ENCOUNTER — Other Ambulatory Visit: Payer: Self-pay | Admitting: Family Medicine

## 2016-07-30 ENCOUNTER — Telehealth: Payer: Self-pay | Admitting: Family Medicine

## 2016-07-30 MED ORDER — PREDNISONE 20 MG PO TABS: ORAL_TABLET | ORAL | 0 refills | Status: DC

## 2016-07-30 NOTE — Telephone Encounter (Signed)
Try prednisone taper pack but ntbs if no better

## 2016-07-30 NOTE — Telephone Encounter (Signed)
Pt aware and med sent to pharm 

## 2016-07-30 NOTE — Telephone Encounter (Signed)
[facility] sent pt to [facility] and she has gotten orthotic inserts and she is now having pain that she thinks is sciatic pain. Please call her to let her know what her options are. [phone].

## 2016-09-01 DIAGNOSIS — M5431 Sciatica, right side: Secondary | ICD-10-CM | POA: Diagnosis not present

## 2016-09-01 DIAGNOSIS — M9903 Segmental and somatic dysfunction of lumbar region: Secondary | ICD-10-CM | POA: Diagnosis not present

## 2016-09-02 DIAGNOSIS — M9903 Segmental and somatic dysfunction of lumbar region: Secondary | ICD-10-CM | POA: Diagnosis not present

## 2016-09-02 DIAGNOSIS — M5431 Sciatica, right side: Secondary | ICD-10-CM | POA: Diagnosis not present

## 2016-09-06 DIAGNOSIS — M9903 Segmental and somatic dysfunction of lumbar region: Secondary | ICD-10-CM | POA: Diagnosis not present

## 2016-09-06 DIAGNOSIS — M5431 Sciatica, right side: Secondary | ICD-10-CM | POA: Diagnosis not present

## 2016-09-07 DIAGNOSIS — M5431 Sciatica, right side: Secondary | ICD-10-CM | POA: Diagnosis not present

## 2016-09-07 DIAGNOSIS — M9903 Segmental and somatic dysfunction of lumbar region: Secondary | ICD-10-CM | POA: Diagnosis not present

## 2016-09-09 DIAGNOSIS — M9903 Segmental and somatic dysfunction of lumbar region: Secondary | ICD-10-CM | POA: Diagnosis not present

## 2016-09-09 DIAGNOSIS — M5431 Sciatica, right side: Secondary | ICD-10-CM | POA: Diagnosis not present

## 2016-09-14 DIAGNOSIS — M9903 Segmental and somatic dysfunction of lumbar region: Secondary | ICD-10-CM | POA: Diagnosis not present

## 2016-09-14 DIAGNOSIS — M5431 Sciatica, right side: Secondary | ICD-10-CM | POA: Diagnosis not present

## 2016-09-15 ENCOUNTER — Other Ambulatory Visit: Payer: Self-pay | Admitting: Family Medicine

## 2016-09-15 DIAGNOSIS — M5431 Sciatica, right side: Secondary | ICD-10-CM | POA: Diagnosis not present

## 2016-09-15 DIAGNOSIS — M9903 Segmental and somatic dysfunction of lumbar region: Secondary | ICD-10-CM | POA: Diagnosis not present

## 2016-09-16 DIAGNOSIS — M5431 Sciatica, right side: Secondary | ICD-10-CM | POA: Diagnosis not present

## 2016-09-16 DIAGNOSIS — M9903 Segmental and somatic dysfunction of lumbar region: Secondary | ICD-10-CM | POA: Diagnosis not present

## 2016-09-17 ENCOUNTER — Ambulatory Visit: Payer: Medicare Other | Admitting: Podiatry

## 2016-09-21 DIAGNOSIS — M5431 Sciatica, right side: Secondary | ICD-10-CM | POA: Diagnosis not present

## 2016-09-21 DIAGNOSIS — M9903 Segmental and somatic dysfunction of lumbar region: Secondary | ICD-10-CM | POA: Diagnosis not present

## 2016-09-23 DIAGNOSIS — M5431 Sciatica, right side: Secondary | ICD-10-CM | POA: Diagnosis not present

## 2016-09-23 DIAGNOSIS — M9903 Segmental and somatic dysfunction of lumbar region: Secondary | ICD-10-CM | POA: Diagnosis not present

## 2016-09-28 DIAGNOSIS — M5431 Sciatica, right side: Secondary | ICD-10-CM | POA: Diagnosis not present

## 2016-09-28 DIAGNOSIS — M9903 Segmental and somatic dysfunction of lumbar region: Secondary | ICD-10-CM | POA: Diagnosis not present

## 2016-09-30 DIAGNOSIS — M9903 Segmental and somatic dysfunction of lumbar region: Secondary | ICD-10-CM | POA: Diagnosis not present

## 2016-09-30 DIAGNOSIS — M5431 Sciatica, right side: Secondary | ICD-10-CM | POA: Diagnosis not present

## 2016-10-05 DIAGNOSIS — M9903 Segmental and somatic dysfunction of lumbar region: Secondary | ICD-10-CM | POA: Diagnosis not present

## 2016-10-05 DIAGNOSIS — M5431 Sciatica, right side: Secondary | ICD-10-CM | POA: Diagnosis not present

## 2016-10-07 DIAGNOSIS — M5431 Sciatica, right side: Secondary | ICD-10-CM | POA: Diagnosis not present

## 2016-10-07 DIAGNOSIS — M9903 Segmental and somatic dysfunction of lumbar region: Secondary | ICD-10-CM | POA: Diagnosis not present

## 2016-10-21 ENCOUNTER — Ambulatory Visit (INDEPENDENT_AMBULATORY_CARE_PROVIDER_SITE_OTHER): Payer: Medicare Other | Admitting: Family Medicine

## 2016-10-21 DIAGNOSIS — Z23 Encounter for immunization: Secondary | ICD-10-CM

## 2016-11-26 ENCOUNTER — Other Ambulatory Visit: Payer: Self-pay | Admitting: Family Medicine

## 2016-11-26 NOTE — Telephone Encounter (Signed)
Ok to refill 

## 2016-11-26 NOTE — Telephone Encounter (Signed)
Denied, why?

## 2016-11-29 ENCOUNTER — Encounter: Payer: Self-pay | Admitting: Family Medicine

## 2016-11-29 ENCOUNTER — Ambulatory Visit: Payer: Medicare Other | Admitting: Family Medicine

## 2016-11-29 ENCOUNTER — Other Ambulatory Visit: Payer: Self-pay

## 2016-11-29 VITALS — BP 138/78 | Temp 98.6°F | Resp 18 | Wt 226.0 lb

## 2016-11-29 DIAGNOSIS — M5431 Sciatica, right side: Secondary | ICD-10-CM

## 2016-11-29 MED ORDER — PREDNISONE 20 MG PO TABS: ORAL_TABLET | ORAL | 0 refills | Status: DC

## 2016-11-29 NOTE — Progress Notes (Signed)
Subjective:    Patient ID: Kelli Terrell, female    DOB: 14-Apr-1950, 66 y.o.   MRN: [id number]  HPI Patient presents with right-sided sciatica for now almost a year.  She saw a chiropractor last December for low back pain radiating into her right gluteus down her right leg.  X-rays at that time at the chiropractor's office revealed degenerative disc disease.  She tried numerous chiropractic adjustments without any relief.  She has been taking gabapentin without any relief.  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  She also reports pins and needles right greater than left on the plantar aspects of her feet.  However the back pain is steadily worsening and the pain radiating down her right leg is steadily worsening.  She is tried almost 12 months of conservative therapy and symptoms are not improving Past Medical History:  Diagnosis Date  . 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 GERD (gastroesophageal reflux disease)   . Hyperlipidemia   . Hypertension   . Internal hemorrhoids    Past Surgical History:  Procedure Laterality Date  . CHOLECYSTECTOMY    . TONSILLECTOMY     Current Outpatient Medications on File Prior to Visit  Medication Sig Dispense Refill  . amLODipine (NORVASC) 5 MG tablet TAKE 1 TABLET (5 MG TOTAL) BY MOUTH DAILY. 90 tablet 3  . aspirin 81 MG tablet Take 81 mg by mouth daily.    . B Complex CAPS Take by mouth.    . Biotin 1 MG CAPS Take by mouth.    . Cholecalciferol (VITAMIN D) 2000 UNITS CAPS Take by mouth.    . fluoruracil (CARAC) 0.5 % cream Apply topically daily. 30 g 0  . furosemide (LASIX) 40 MG tablet TAKE 1 TABLET (40 MG TOTAL) BY MOUTH DAILY. 30 tablet 5  . gabapentin (NEURONTIN) 300 MG capsule Take 1 capsule (300 mg total) by mouth 3 (three) times daily. 270 capsule 3  . hydrochlorothiazide (HYDRODIURIL) 25 MG tablet Take 25 mg by  mouth daily.    Marland Kitchen omeprazole (PRILOSEC) 40 MG capsule TAKE ONE CAPSULE BY MOUTH EVERY DAY 90 capsule 3  . MIMVEY 1-0.5 MG tablet TAKE 1 TABLET BY MOUTH EVERY DAY (Patient not taking: Reported on 11/29/2016) 28 tablet 5   No current facility-administered medications on file prior to visit.    Allergies  Allergen Reactions  . Codeine Nausea And Vomiting   Social History   Socioeconomic History  . Marital status: Married    Spouse name: Not on file  . Number of children: Not on file  . Years of education: Not on file  . Highest education level: Not on file  Social Needs  . Financial resource strain: Not on file  . Food insecurity - worry: Not on file  . Food insecurity - inability: Not on file  . Transportation needs - medical: Not on file  . Transportation needs - non-medical: Not on file  Occupational History  . Not on file  Tobacco Use  . Smoking status: Never Smoker  . Smokeless tobacco: Never Used  Substance and Sexual Activity  . Alcohol use: Yes    Comment: Rare  . Drug use: No  . Sexual activity: Not on file  Other Topics Concern  . Not on file  Social History Narrative   Married. Retired.    Never smoked.  Review of Systems  All other systems reviewed and are negative.      Objective:   Physical Exam  Cardiovascular: Normal rate, regular rhythm and normal heart sounds.  Pulmonary/Chest: Effort normal and breath sounds normal. No respiratory distress. She has no wheezes. She has no rales.  Musculoskeletal:       Lumbar back: She exhibits decreased range of motion, tenderness and pain.       Back:  Vitals reviewed.   Negative straight leg raise bilaterally.  Muscle strength is 5/5 equal and symmetric in the upper and lower extremities.      Assessment & Plan:  Right sided sciatica  I will proceed with an MRI of the lumbar spine.  Meanwhile start the patient on a prednisone taper pack in addition to her gabapentin.  Patient would likely  benefit from epidural steroid injections as she is failed conservative therapy

## 2016-12-22 ENCOUNTER — Other Ambulatory Visit: Payer: Self-pay | Admitting: Family Medicine

## 2016-12-25 ENCOUNTER — Other Ambulatory Visit: Payer: Self-pay | Admitting: Family Medicine

## 2017-01-07 ENCOUNTER — Ambulatory Visit
Admission: RE | Admit: 2017-01-07 | Discharge: 2017-01-07 | Disposition: A | Discharge status: Home / Self Care | Payer: Medicare Other | Source: Ambulatory Visit | Attending: Family Medicine | Admitting: Family Medicine

## 2017-01-07 DIAGNOSIS — M48061 Spinal stenosis, lumbar region without neurogenic claudication: Secondary | ICD-10-CM | POA: Diagnosis not present

## 2017-01-07 DIAGNOSIS — M5431 Sciatica, right side: Secondary | ICD-10-CM

## 2017-01-10 ENCOUNTER — Encounter: Payer: Medicare Other | Admitting: Family Medicine

## 2017-01-12 ENCOUNTER — Other Ambulatory Visit: Payer: Medicare Other

## 2017-01-12 DIAGNOSIS — I10 Essential (primary) hypertension: Secondary | ICD-10-CM | POA: Diagnosis not present

## 2017-01-12 DIAGNOSIS — Z0000 Encounter for general adult medical examination without abnormal findings: Secondary | ICD-10-CM

## 2017-01-12 DIAGNOSIS — E7849 Other hyperlipidemia: Secondary | ICD-10-CM

## 2017-01-13 LAB — CBC WITH DIFFERENTIAL/PLATELET
BASOS ABS: 73 {cells}/uL (ref 0–200)
Basophils Relative: 0.9 %
Eosinophils Absolute: 275 cells/uL (ref 15–500)
Eosinophils Relative: 3.4 %
HCT: 37.4 % (ref 35.0–45.0)
HEMOGLOBIN: 12.6 g/dL (ref 11.7–15.5)
Lymphs Abs: 2276 cells/uL (ref 850–3900)
MCH: 29.2 pg (ref 27.0–33.0)
MCHC: 33.7 g/dL (ref 32.0–36.0)
MCV: 86.6 fL (ref 80.0–100.0)
MPV: 12.4 fL (ref 7.5–12.5)
Monocytes Relative: 7.3 %
NEUTROS ABS: 4884 {cells}/uL (ref 1500–7800)
Neutrophils Relative %: 60.3 %
Platelets: 249 10*3/uL (ref 140–400)
RBC: 4.32 10*6/uL (ref 3.80–5.10)
RDW: 12.7 % (ref 11.0–15.0)
TOTAL LYMPHOCYTE: 28.1 %
WBC: 8.1 10*3/uL (ref 3.8–10.8)
WBCMIX: 591 {cells}/uL (ref 200–950)

## 2017-01-13 LAB — COMPREHENSIVE METABOLIC PANEL
AG RATIO: 1.4 (calc) (ref 1.0–2.5)
ALT: 33 U/L — AB (ref 6–29)
AST: 37 U/L — AB (ref 10–35)
Albumin: 3.8 g/dL (ref 3.6–5.1)
Alkaline phosphatase (APISO): 80 U/L (ref 33–130)
BUN: 15 mg/dL (ref 7–25)
CALCIUM: 9.9 mg/dL (ref 8.6–10.4)
CO2: 25 mmol/L (ref 20–32)
Chloride: 103 mmol/L (ref 98–110)
Creat: 0.87 mg/dL (ref 0.50–0.99)
GLUCOSE: 101 mg/dL — AB (ref 65–99)
Globulin: 2.8 g/dL (calc) (ref 1.9–3.7)
Potassium: 3.7 mmol/L (ref 3.5–5.3)
Sodium: 138 mmol/L (ref 135–146)
Total Bilirubin: 0.4 mg/dL (ref 0.2–1.2)
Total Protein: 6.6 g/dL (ref 6.1–8.1)

## 2017-01-13 LAB — LIPID PANEL
CHOLESTEROL: 193 mg/dL (ref <200)
HDL: 57 mg/dL (ref >50)
LDL Cholesterol (Calc): 115 mg/dL (calc) — ABNORMAL HIGH
NON-HDL CHOLESTEROL (CALC): 136 mg/dL — AB (ref <130)
TRIGLYCERIDES: 103 mg/dL (ref <150)
Total CHOL/HDL Ratio: 3.4 (calc) (ref <5.0)

## 2017-01-14 ENCOUNTER — Ambulatory Visit (INDEPENDENT_AMBULATORY_CARE_PROVIDER_SITE_OTHER): Payer: Medicare Other | Admitting: Family Medicine

## 2017-01-14 ENCOUNTER — Encounter: Payer: Self-pay | Admitting: Family Medicine

## 2017-01-14 VITALS — BP 144/82 | HR 80 | Temp 97.7°F | Resp 18 | Ht 66.0 in | Wt 219.0 lb

## 2017-01-14 DIAGNOSIS — Z0000 Encounter for general adult medical examination without abnormal findings: Secondary | ICD-10-CM | POA: Diagnosis not present

## 2017-01-14 DIAGNOSIS — G609 Hereditary and idiopathic neuropathy, unspecified: Secondary | ICD-10-CM

## 2017-01-14 DIAGNOSIS — M5431 Sciatica, right side: Secondary | ICD-10-CM | POA: Diagnosis not present

## 2017-01-14 DIAGNOSIS — I10 Essential (primary) hypertension: Secondary | ICD-10-CM

## 2017-01-14 DIAGNOSIS — E7849 Other hyperlipidemia: Secondary | ICD-10-CM

## 2017-01-14 MED ORDER — GABAPENTIN 300 MG PO CAPS: 600.0000 mg | ORAL_CAPSULE | Freq: Three times a day (TID) | ORAL | 3 refills | Status: DC

## 2017-01-14 NOTE — Progress Notes (Signed)
Subjective:    Patient ID: Kelli Terrell, female    DOB: 10/05/1950, 67 y.o.   MRN: [id number]  HPI Here for CPE.  Last colonoscopy was in 2013.  Mammogram 1/18 and DEXA which was excellent was 1/16.  Pneumovax, Tdap, and influenza shots are up to date.  Prevnar 13 is also UTD.  Zostavax given in 2010. Immunization History  Administered Date(s) Administered  . Influenza,inj,Quad PF,6+ Mos 11/23/2012, 12/20/2013, 11/01/2014, 01/08/2016, 10/21/2016  . Influenza-Unspecified 12/21/2010, 10/04/2011  . Pneumococcal Conjugate-13 12/27/2014  . Pneumococcal Polysaccharide-23 12/20/2013  . 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 She complains of numbness and tingling in both feet and burning in both feet. This tends to be worse at night when she's trying to sleep. She is on gabapentin 300 mg 3 times a day and see some mild benefit from this. She is interested in increasing the dose. She also has right-sided sciatica. Recent MRI showed bulging disc at L4-L5, L3-L4, and L5-S1. The worse is at L5-S1 but in all 3 levels, there is right foraminal stenosis and possible impingement upon the exiting nerve root. However at the present time, the patient has no desire for epidural steroid injections in the back. The sciatica in her right leg is mild. It tends to worsen when she is more physically active. Lab on 01/12/2017  Component Date Value Ref Range Status  . WBC 01/12/2017 8.1  3.8 - 10.8 Thousand/uL Final  . RBC 01/12/2017 4.32  3.80 - 5.10 Million/uL Final  . Hemoglobin 01/12/2017 12.6  11.7 - 15.5 g/dL Final  . HCT 01/12/2017 37.4  35.0 - 45.0 % Final  . MCV 01/12/2017 86.6  80.0 - 100.0 fL Final  . MCH 01/12/2017 29.2  27.0 - 33.0 pg Final  . MCHC 01/12/2017 33.7  32.0 - 36.0 g/dL Final  . RDW 01/12/2017 12.7  11.0 - 15.0 % Final  . Platelets 01/12/2017 249  140 - 400 Thousand/uL Final  . MPV 01/12/2017 12.4  7.5 - 12.5 fL Final  . Neutro Abs 01/12/2017 4,884  1,500 -  7,800 cells/uL Final  . Lymphs Abs 01/12/2017 2,276  850 - 3,900 cells/uL Final  . WBC mixed population 01/12/2017 591  200 - 950 cells/uL Final  . Eosinophils Absolute 01/12/2017 275  15 - 500 cells/uL Final  . Basophils Absolute 01/12/2017 73  0 - 200 cells/uL Final  . Neutrophils Relative % 01/12/2017 60.3  % Final  . Total Lymphocyte 01/12/2017 28.1  % Final  . Monocytes Relative 01/12/2017 7.3  % Final  . Eosinophils Relative 01/12/2017 3.4  % Final  . Basophils Relative 01/12/2017 0.9  % Final  . Glucose, Bld 01/12/2017 101* 65 - 99 mg/dL Final   Comment: .            Fasting reference interval . For someone without known diabetes, a glucose value between 100 and 125 mg/dL is consistent with prediabetes and should be confirmed with a follow-up test. .   . BUN 01/12/2017 15  7 - 25 mg/dL Final  . Creat 01/12/2017 0.87  0.50 - 0.99 mg/dL Final   Comment: For patients >77 years of age, the reference limit for Creatinine is approximately 13% higher for people identified as African-American. .   Havery Moros Ratio 61/95/0932 NOT APPLICABLE  6 - 22 (calc) Final  . Sodium 01/12/2017 138  135 - 146 mmol/L Final  . Potassium 01/12/2017 3.7  3.5 - 5.3 mmol/L Final  .  Chloride 01/12/2017 103  98 - 110 mmol/L Final  . CO2 01/12/2017 25  20 - 32 mmol/L Final  . Calcium 01/12/2017 9.9  8.6 - 10.4 mg/dL Final  . Total Protein 01/12/2017 6.6  6.1 - 8.1 g/dL Final  . Albumin 01/12/2017 3.8  3.6 - 5.1 g/dL Final  . Globulin 01/12/2017 2.8  1.9 - 3.7 g/dL (calc) Final  . AG Ratio 01/12/2017 1.4  1.0 - 2.5 (calc) Final  . Total Bilirubin 01/12/2017 0.4  0.2 - 1.2 mg/dL Final  . Alkaline phosphatase (APISO) 01/12/2017 80  33 - 130 U/L Final  . AST 01/12/2017 37* 10 - 35 U/L Final  . ALT 01/12/2017 33* 6 - 29 U/L Final  . Cholesterol 01/12/2017 193  <200 mg/dL Final  . HDL 01/12/2017 57  >50 mg/dL Final  . Triglycerides 01/12/2017 103  <150 mg/dL Final  . LDL Cholesterol (Calc)  01/12/2017 115* mg/dL (calc) Final   Comment: Reference range: <100 . Desirable range <100 mg/dL for primary prevention;   <70 mg/dL for patients with CHD or diabetic patients  with > or = 2 CHD risk factors. Marland Kitchen LDL-C is now calculated using the Martin-Hopkins  calculation, which is a validated novel method providing  better accuracy than the Friedewald equation in the  estimation of LDL-C.  Cresenciano Genre et al. Annamaria Helling. 2297;989(21): 2061-2068  (http://education.[facility].com/faq/FAQ164)   . Total CHOL/HDL Ratio 01/12/2017 3.4  <5.0 (calc) Final  . Non-HDL Cholesterol (Calc) 01/12/2017 136* <130 mg/dL (calc) Final   Comment: For patients with diabetes plus 1 major ASCVD risk  factor, treating to a non-HDL-C goal of <100 mg/dL  (LDL-C of <70 mg/dL) is considered a therapeutic  option.     Past Medical History:  Diagnosis Date  . 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 GERD (gastroesophageal reflux disease)   . Hyperlipidemia   . Hypertension   . Internal hemorrhoids    Past Surgical History:  Procedure Laterality Date  . CHOLECYSTECTOMY    . TONSILLECTOMY     Current Outpatient Medications on File Prior to Visit  Medication Sig Dispense Refill  . amLODipine (NORVASC) 5 MG tablet TAKE 1 TABLET (5 MG TOTAL) BY MOUTH DAILY. 90 tablet 3  . aspirin 81 MG tablet Take 81 mg by mouth daily.    . B Complex CAPS Take by mouth.    . Biotin 1 MG CAPS Take by mouth.    . Cholecalciferol (VITAMIN D) 2000 UNITS CAPS Take by mouth.    . fluoruracil (CARAC) 0.5 % cream Apply topically daily. 30 g 0  . furosemide (LASIX) 40 MG tablet TAKE 1 TABLET (40 MG TOTAL) BY MOUTH DAILY. 30 tablet 5  . gabapentin (NEURONTIN) 300 MG capsule Take 1 capsule (300 mg total) by mouth 3 (three) times daily. 270 capsule 3  . hydrochlorothiazide (HYDRODIURIL) 25 MG tablet Take 25 mg by mouth daily.    Marland Kitchen MIMVEY 1-0.5 MG tablet TAKE 1 TABLET BY MOUTH EVERY DAY (Patient taking  differently: TAKE 1 TABLET BY MOUTH EVERY OTHER DAY) 28 tablet 11  . omeprazole (PRILOSEC) 40 MG capsule TAKE ONE CAPSULE BY MOUTH EVERY DAY 90 capsule 3   No current facility-administered medications on file prior to visit.    Allergies  Allergen Reactions  . Codeine Nausea And Vomiting   Social History   Socioeconomic History  . Marital status: Married    Spouse name: Not on file  . Number of children: Not on file  .  Years of education: Not on file  . Highest education level: Not on file  Social Needs  . Financial resource strain: Not on file  . Food insecurity - worry: Not on file  . Food insecurity - inability: Not on file  . Transportation needs - medical: Not on file  . Transportation needs - non-medical: Not on file  Occupational History  . Not on file  Tobacco Use  . Smoking status: Never Smoker  . Smokeless tobacco: Never Used  Substance and Sexual Activity  . Alcohol use: Yes    Comment: Rare  . Drug use: No  . Sexual activity: Not on file  Other Topics Concern  . Not on file  Social History Narrative   Married. Retired.    Never smoked.    Family History  Problem Relation Age of Onset  . Cancer Father        kidney  . Stroke Brother   . Cancer Brother        Thyroid Cancer  . Heart disease Brother      Review of Systems  All other systems reviewed and are negative.      Objective:   Physical Exam  Constitutional: She is oriented to person, place, and time. She appears well-developed and well-nourished. No distress.  HENT:  Head: Normocephalic and atraumatic.  Right Ear: External ear normal.  Left Ear: External ear normal.  Nose: Nose normal.  Mouth/Throat: Oropharynx is clear and moist. No oropharyngeal exudate.  Eyes: Conjunctivae and EOM are normal. Pupils are equal, round, and reactive to light. Right eye exhibits no discharge. Left eye exhibits no discharge. No scleral icterus.  Neck: Normal range of motion. Neck supple. No JVD present.  No tracheal deviation present. No thyromegaly present.  Cardiovascular: Normal rate, regular rhythm, normal heart sounds and intact distal pulses. Exam reveals no gallop and no friction rub.  No murmur heard. Pulmonary/Chest: Effort normal and breath sounds normal. No stridor. No respiratory distress. She has no wheezes. She has no rales. She exhibits no tenderness.  Abdominal: Soft. Bowel sounds are normal. She exhibits no distension and no mass. There is no tenderness. There is no rebound and no guarding.  Musculoskeletal: Normal range of motion. She exhibits no edema or tenderness.  Lymphadenopathy:    She has no cervical adenopathy.  Neurological: She is alert and oriented to person, place, and time. She has normal reflexes. No cranial nerve deficit. She exhibits normal muscle tone. Coordination normal.  Skin: Skin is warm. No rash noted. She is not diaphoretic. No erythema. No pallor.  Psychiatric: She has a normal mood and affect. Her behavior is normal. Judgment and thought content normal.  Vitals reviewed.         Assessment & Plan:  Routine general medical examination at a health care facility  Essential hypertension  Other hyperlipidemia  The patient's cancer screening is up-to-date. She no longer requires Pap smear. Colonoscopy is not due until 2023. Mammogram is due in January. Increase gabapentin gradually. Begin 600 mg at night. Patient can gradually increase to 600 mg 3 times a day. Blood pressure is excellent. Lab work is significant only for mild elevation in liver function tests. However patient states that she's been taking Tylenol every day. I recommended discontinuation of Tylenol and recheck a CMP in 2 months. Also recommended giving increased aerobic exercise. Her sugar is slightly high and I recommended decreasing her consumption of carbohydrates. At the present time she declines an epidural steroid injection for  her right-sided sciatica. Therefore will try increasing  the gabapentin to help manage her pain.  Discussed shingrix.

## 2017-01-27 DIAGNOSIS — Z1231 Encounter for screening mammogram for malignant neoplasm of breast: Secondary | ICD-10-CM | POA: Diagnosis not present

## 2017-03-22 ENCOUNTER — Ambulatory Visit (INDEPENDENT_AMBULATORY_CARE_PROVIDER_SITE_OTHER): Payer: Medicare Other | Admitting: Family Medicine

## 2017-03-22 ENCOUNTER — Other Ambulatory Visit: Payer: Self-pay

## 2017-03-22 ENCOUNTER — Encounter: Payer: Self-pay | Admitting: Family Medicine

## 2017-03-22 VITALS — BP 132/68 | HR 80 | Temp 99.0°F | Resp 16 | Ht 66.0 in | Wt 223.0 lb

## 2017-03-22 DIAGNOSIS — J029 Acute pharyngitis, unspecified: Secondary | ICD-10-CM | POA: Diagnosis not present

## 2017-03-22 DIAGNOSIS — J0100 Acute maxillary sinusitis, unspecified: Secondary | ICD-10-CM

## 2017-03-22 MED ORDER — AMOXICILLIN 875 MG PO TABS: 875.0000 mg | ORAL_TABLET | Freq: Two times a day (BID) | ORAL | 0 refills | Status: DC

## 2017-03-22 NOTE — Patient Instructions (Addendum)
F/U as needed  Use salt water spray for nose Take antibiotics  Salt water garlge

## 2017-03-22 NOTE — Progress Notes (Signed)
   Subjective:    Patient ID: Kelli Terrell, female    DOB: [birth year], 67 y.o.   MRN: [id number]  Patient presents for Illness (x3 days- sore throat, nasal congestion, fever/ chills at night, sinus pressure)   Pt here with sore throat, congestion, sinus pressure. Cant feel any drainage down the back of her throat. Facial pain ear pain, feels pain in lymph node. Getting some headaches in the evening     No fever   Taking Chlorospetic lozenges   Review Of Systems:  GEN- denies fatigue, fever, weight loss,weakness, recent illness HEENT- denies eye drainage, change in vision, +nasal discharge, CVS- denies chest pain, palpitations RESP- denies SOB, cough, wheeze ABD- denies N/V, change in stools, abd pain GU- denies dysuria, hematuria, dribbling, incontinence MSK- denies joint pain, muscle aches, injury Neuro- denies headache, dizziness, syncope, seizure activity       Objective:    BP 132/68   Pulse 80   Temp 99 F (37.2 C) (Oral)   Resp 16   Ht 5\' 6"  (1.676 m)   Wt 223 lb (101.2 kg)   SpO2 95%   BMI 35.99 kg/m  GEN- NAD, alert and oriented x3 HEENT- PERRL, EOMI, non injected sclera, pink conjunctiva, MMM, oropharynx mild injection, TM clear bilat no effusion,  + maxillary sinus tenderness, inflammed turbinates,  Nasal drainage  Neck- Supple, large ant and submandibular LAD CVS- RRR, no murmur RESP-CTAB EXT- No edema Pulses- Radial 2+          Assessment & Plan:      Problem List Items Addressed This Visit    None    Visit Diagnoses    Acute pharyngitis, unspecified etiology    -  Primary   sinusitis with pharngytitis symptoms, likely post nasal causing the inflammation, treat amox, nasal saline/steroid, antihistamine,salt water gargle   Relevant Orders   STREP GROUP A AG, W/REFLEX TO CULT (Completed)   Acute maxillary sinusitis, recurrence not specified       Relevant Medications   amoxicillin (AMOXIL) 875 MG tablet      Note: This dictation was prepared  with Dragon dictation along with smaller phrase technology. Any transcriptional errors that result from this process are unintentional.

## 2017-03-23 ENCOUNTER — Other Ambulatory Visit: Payer: Self-pay | Admitting: Physician Assistant

## 2017-03-23 ENCOUNTER — Encounter: Payer: Self-pay | Admitting: Family Medicine

## 2017-03-24 LAB — CULTURE, GROUP A STREP
MICRO NUMBER: 90310635
SPECIMEN QUALITY: ADEQUATE

## 2017-03-24 LAB — STREP GROUP A AG, W/REFLEX TO CULT: Streptococcus, Group A Screen (Direct): NOT DETECTED

## 2017-03-25 ENCOUNTER — Telehealth: Payer: Self-pay

## 2017-03-25 NOTE — Telephone Encounter (Signed)
Patient called complaining of sore throat, headache, congestion and drainage. Patient was instructed to continue to take her antibiotic and gargle in warm salt water to help with sore throat.   Patient was informed to follow up if her symptoms have not improved after completing the antibiotic or if  she notice wheezing or SOB then proceed to the ER. Patient verbalizes understanding

## 2017-05-03 DIAGNOSIS — D225 Melanocytic nevi of trunk: Secondary | ICD-10-CM | POA: Diagnosis not present

## 2017-05-03 DIAGNOSIS — D3617 Benign neoplasm of peripheral nerves and autonomic nervous system of trunk, unspecified: Secondary | ICD-10-CM | POA: Diagnosis not present

## 2017-05-03 DIAGNOSIS — Z85828 Personal history of other malignant neoplasm of skin: Secondary | ICD-10-CM | POA: Diagnosis not present

## 2017-05-03 DIAGNOSIS — D2262 Melanocytic nevi of left upper limb, including shoulder: Secondary | ICD-10-CM | POA: Diagnosis not present

## 2017-05-03 DIAGNOSIS — D485 Neoplasm of uncertain behavior of skin: Secondary | ICD-10-CM | POA: Diagnosis not present

## 2017-05-03 DIAGNOSIS — L821 Other seborrheic keratosis: Secondary | ICD-10-CM | POA: Diagnosis not present

## 2017-06-29 ENCOUNTER — Other Ambulatory Visit: Payer: Self-pay | Admitting: Family Medicine

## 2017-09-14 ENCOUNTER — Other Ambulatory Visit: Payer: Self-pay

## 2017-09-14 ENCOUNTER — Ambulatory Visit (INDEPENDENT_AMBULATORY_CARE_PROVIDER_SITE_OTHER): Payer: Medicare Other | Admitting: Family Medicine

## 2017-09-14 ENCOUNTER — Encounter: Payer: Self-pay | Admitting: Family Medicine

## 2017-09-14 VITALS — BP 140/88 | HR 84 | Temp 98.3°F | Resp 14 | Ht 66.0 in | Wt 224.0 lb

## 2017-09-14 DIAGNOSIS — B001 Herpesviral vesicular dermatitis: Secondary | ICD-10-CM

## 2017-09-14 MED ORDER — VALACYCLOVIR HCL 1 G PO TABS: 1000.0000 mg | ORAL_TABLET | Freq: Two times a day (BID) | ORAL | 1 refills | Status: DC

## 2017-09-14 NOTE — Patient Instructions (Signed)
Start valtrex twice a day for 7-10 days  If the blood test is negative we will stop the antiviral medicine.  Avoid citrus, trauma and sun exposure  Keep a barrier on lips at all times, avoid licking.  Take a multivitamin in addition to your B complex.  Herpes Simplex Test There are two common types of herpes simplex virus (HSV). These are classified as Type 1 (HSV1) or Type 2 (HSV2). Type 1 often causes cold sores on or around the mouth and sometimes on or around the eyes. Type 2 is commonly known as a sexually transmitted infection that causes sores in and around the genitals. Both types of herpes simplex can cause sores in different areas. There are two types of herpes simplex tests. These include:  Culture. This consists of collecting and testing a sample of fluid with a cotton swab from an open sore. This can only be done during an active infection (outbreak). Culture tests take several days to complete but are very accurate.  HSV blood tests. This test is not as accurate as a culture. However, HSV blood tests are faster than cultures, often providing a test result within one day. ? HSV antibody test. This checks for the presence of antibodies against HSV in your blood. Antibodies are proteins your body makes to help fight infection. ? HSV antigen test. This checks for the presence of the HSV germ (antigen) in your blood.  Your health care provider may recommend you have a HSV test if:  He or she believes you have a HSV infection.  You have a weakened immune system (immunocompromised) and you have sores around your mouth or genitals that look like HSV eruptions.  You have a fever of unknown origin (FUO).  You are pregnant, have herpes, and are expecting to deliver a baby vaginally in the next 6-8 weeks.  What do the results mean? It is your responsibility to obtain your test results. Ask the lab or department performing the test when and how you will get your results. Contact your  health care provider to discuss any questions you have about your results. Range of Normal Values Ranges for normal values may vary among different labs and hospitals. You should always check with your health care provider after having lab work or other tests done to discuss whether your values are considered within normal limits. Normal findings include:  No HSV antigen or antibodies present in your blood.  No HSV antigen present in cultured fluid.  Meaning of Results Outside Normal Ranges The following test results may indicate that you have an active HSV infection:  Positive culture for HSV1 or HSV2.  Presence of HSV1 or HSV2 antigens in your blood.  Presence of certain HSV1 or HSV2 antibodies (IgM) in your blood.  Discuss your test results with your health care provider. He or she will use the results to make a diagnosis and determine a treatment plan that is right for you. Talk with your health care provider to discuss your results, treatment options, and if necessary, the need for more tests. Talk with your health care provider if you have any questions about your results. This information is not intended to replace advice given to you by your health care provider. Make sure you discuss any questions you have with your health care provider. Document Released: 01/31/2004 Document Revised: 09/03/2015 Document Reviewed: 05/15/2013 Elsevier Interactive Patient Education  2018 [employer] [place]   Cold Sore A cold sore, also called a fever blister, is a  skin infection that is caused by a virus. This infection causes small, fluid-filled sores to form inside of the mouth or on the lips, gums, nose, chin, or cheeks. Cold sores can spread to other parts of the body, such as the eyes or fingers. Cold sores can be spread or passed from person to person (contagious) until the sores crust over completely. Cold sores can be spread through close contact, such as kissing or sharing a drinking  glass. Follow these instructions at home: Medicines  Take or apply over-the-counter and prescription medicines only as told by your doctor.  Use a cotton-tip swab to apply creams or gels to your sores. Sore Care  Do not touch the sores or pick the scabs.  Wash your hands often. Do not touch your eyes without washing your hands first.  Keep the sores clean and dry.  If directed, apply ice to the sores:  Put ice in a plastic bag.  Place a towel between your skin and the bag.  Leave the ice on for 20 minutes, 2-3 times per day. Lifestyle  Do not kiss, have oral sex, or share personal items until your sores heal.  Eat a soft, bland diet. Avoid eating hot, cold, or salty foods. These can hurt your mouth.  Use a straw if it hurts to drink out of a glass.  Avoid the sun and limit your stress if these things trigger outbreaks. If sun causes cold sores, apply sunscreen on your lips before being out in the sun. Contact a doctor if:  You have symptoms for more than two weeks.  You have pus coming from the sores.  You have redness that is spreading.  You have pain or irritation in your eye.  You get sores on your genitals.  Your sores do not heal within two weeks.  You get cold sores often. Get help right away if:  You have a fever and your symptoms suddenly get worse.  You have a headache and confusion. This information is not intended to replace advice given to you by your health care provider. Make sure you discuss any questions you have with your health care provider. Document Released: 06/29/2011 Document Revised: 06/05/2015 Document Reviewed: 10/18/2014 Elsevier Interactive Patient Education  [employer].

## 2017-09-14 NOTE — Progress Notes (Signed)
Patient ID: Kelli Terrell, female    DOB: 03-Mar-1950, 67 y.o.   MRN: [id number]  PCP: Susy Frizzle, MD  Chief Complaint  Patient presents with  . Lip Irritation    has had frequent fever blisters to bottom lip with no relief from Abreva    Subjective:   [name] is a 67 y.o. female, presents to clinic with CC of of lower lip cold sores x 6 weeks not resolving with abreeva - has used 3 tubes in 6 weeks.  She does have a hx of "fever blisters" for most of her life, including multiple areas of scarring to her lower lip and 2-3 spots of scarring below the [place] border.  After several weeks of not improving she returned to the pharmacy and the pharmacist told her she needed a prescription for something stronger than the abreeva.  She also noted no improvement over 6 weeks with applying vaseline and neosporin.  She has been careful to put sunblock on when being outdoors.  She denies any trauma or illness around the time of onset.  She has not had oral antivirals before that she knows of.  She is already supplementing with B-complex and multivitamin.  She points to right side of lower lip for where "fever blister" is located.  It has not spread.  It burns and feels chronically irritated and dry.  She denies bleeding, purulent drainage or any other sores, ulcers or rash.     Patient Active Problem List   Diagnosis Date Noted  . ASCUS (atypical squamous cells of undetermined significance) on Pap smear 12/14/2012  . Hyperlipidemia   . Hypertension   . GERD (gastroesophageal reflux disease)      Prior to Admission medications   Medication Sig Start Date End Date Taking? Authorizing Provider  amLODipine (NORVASC) 5 MG tablet TAKE 1 TABLET (5 MG TOTAL) BY MOUTH DAILY. 12/27/16  Yes Susy Frizzle, MD  amoxicillin (AMOXIL) 875 MG tablet Take 1 tablet (875 mg total) by mouth 2 (two) times daily. 03/22/17  Yes [place], Modena Nunnery, MD  aspirin 81 MG tablet Take 81 mg by mouth daily.   Yes  [provider]  B Complex CAPS Take by mouth.   Yes [provider]  Biotin 1 MG CAPS Take by mouth.   Yes [provider]  Cholecalciferol (VITAMIN D) 2000 UNITS CAPS Take by mouth.   Yes [provider]  fluoruracil (CARAC) 0.5 % cream Apply topically daily. 12/14/12  Yes Dena Billet B, PA-C  furosemide (LASIX) 40 MG tablet TAKE 1 TABLET (40 MG TOTAL) BY MOUTH DAILY. 09/15/16  Yes Susy Frizzle, MD  gabapentin (NEURONTIN) 300 MG capsule Take 2 capsules (600 mg total) by mouth 3 (three) times daily. Patient taking differently: Take 300 mg by mouth 2 (two) times daily. Only takes as needed 01/14/17  Yes [place], Cammie Mcgee, MD  hydrochlorothiazide (HYDRODIURIL) 25 MG tablet Take 25 mg by mouth daily.   Yes [provider]  hydrochlorothiazide (HYDRODIURIL) 25 MG tablet TAKE 1 TABLET BY MOUTH EVERY DAY 03/23/17  Yes Susy Frizzle, MD  omeprazole (PRILOSEC) 40 MG capsule TAKE ONE CAPSULE BY MOUTH EVERY DAY 06/30/17  Yes Susy Frizzle, MD     Allergies  Allergen Reactions  . Codeine Nausea And Vomiting     Family History  Problem Relation Age of Onset  . Cancer Father        kidney  . Stroke Brother   .  Cancer Brother        Thyroid Cancer  . Heart disease Brother      Social History   Socioeconomic History  . Marital status: Married    Spouse name: Not on file  . Number of children: Not on file  . Years of education: Not on file  . Highest education level: Not on file  Occupational History  . Not on file  Social Needs  . Financial resource strain: Not on file  . Food insecurity:    Worry: Not on file    Inability: Not on file  . Transportation needs:    Medical: Not on file    Non-medical: Not on file  Tobacco Use  . Smoking status: Never Smoker  . Smokeless tobacco: Never Used  Substance and Sexual Activity  . Alcohol use: Yes    Comment: Rare  . Drug use: No  . Sexual activity: Not on file  Lifestyle  .  Physical activity:    Days per week: Not on file    Minutes per session: Not on file  . Stress: Not on file  Relationships  . Social connections:    Talks on phone: Not on file    Gets together: Not on file    Attends religious service: Not on file    Active member of club or organization: Not on file    Attends meetings of clubs or organizations: Not on file    Relationship status: Not on file  . Intimate partner violence:    Fear of current or ex partner: Not on file    Emotionally abused: Not on file    Physically abused: Not on file    Forced sexual activity: Not on file  Other Topics Concern  . Not on file  Social History Narrative   Married. Retired.    Never smoked.      Review of Systems  Constitutional: Negative.  Negative for activity change, appetite change, chills, diaphoresis, fatigue, fever and unexpected weight change.  HENT: Negative.   Eyes: Negative.   Respiratory: Negative.   Cardiovascular: Negative.   Gastrointestinal: Negative.   Endocrine: Negative.   Genitourinary: Negative.   Musculoskeletal: Negative.   Skin: Negative.  Negative for rash.  Allergic/Immunologic: Negative.   Neurological: Negative.   Hematological: Negative.   Psychiatric/Behavioral: Negative.   All other systems reviewed and are negative.      Objective:    Vitals:   09/14/17 1146  BP: 140/88  Pulse: 84  Resp: 14  Temp: 98.3 F (36.8 C)  TempSrc: Oral  SpO2: 96%  Weight: 224 lb (101.6 kg)  Height: 5\' 6"  (1.676 m)      Physical Exam  Constitutional: She appears well-developed and well-nourished. No distress.  Slightly anxious appearing female, appears stated age and appears well  HENT:  Head: Normocephalic and atraumatic.  Right Ear: External ear normal.  Left Ear: External ear normal.  Nose: Nose normal. No mucosal edema or rhinorrhea. Right sinus exhibits no maxillary sinus tenderness and no frontal sinus tenderness. Left sinus exhibits no maxillary sinus  tenderness and no frontal sinus tenderness.  Mouth/Throat: Uvula is midline, oropharynx is clear and moist and mucous membranes are normal. Mucous membranes are not pale, not dry and not cyanotic. No oral lesions. Normal dentition. No uvula swelling. No posterior oropharyngeal edema or posterior oropharyngeal erythema.  Three 1x2 mm areas of hypopigmentation and scarring to bottom lip, no visible ulcers, wounds or lesions, skin intact, no edema, no  erythema, no drainage.  Pt licked her lips in excess of 30 x while doing history and exam  Eyes: Conjunctivae are normal. Right eye exhibits no discharge. Left eye exhibits no discharge.  Neck: No tracheal deviation present.  Cardiovascular: Normal rate and regular rhythm.  Pulmonary/Chest: Effort normal. No stridor. No respiratory distress.  Musculoskeletal: Normal range of motion.  Neurological: She is alert. She exhibits normal muscle tone. Coordination normal.  Skin: Skin is warm and dry. No rash noted. She is not diaphoretic.  Psychiatric: She has a normal mood and affect. Her behavior is normal.  Nursing note and vitals reviewed.         Assessment & Plan:      ICD-10-CM   1. 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  Pt here for "stronger" medicine.  With onset so long ago I do not know it valtrex or acyclivir will provide any benefit.  Sores may also have other etiology.  The pt nervously licks her lips multiple times per minute and even after mentioning it to her, how she needs to avoid licking lips, she was unable to stop doing so.    Plan to test blood for HSV-1 prior to tx with valtrex.  Discussed use of valtrex at the onset of a cold sores.  Pt encouraged to focus on hydrating lips, continue to apply sunblock, continue supplements, good nutrition and good hydration.      Delsa Grana, PA-C 09/14/17 11:56 AM

## 2017-09-29 DIAGNOSIS — L0101 Non-bullous impetigo: Secondary | ICD-10-CM | POA: Diagnosis not present

## 2017-09-29 DIAGNOSIS — K130 Diseases of lips: Secondary | ICD-10-CM | POA: Diagnosis not present

## 2017-11-30 ENCOUNTER — Ambulatory Visit (INDEPENDENT_AMBULATORY_CARE_PROVIDER_SITE_OTHER): Payer: Medicare Other | Admitting: Family Medicine

## 2017-11-30 DIAGNOSIS — Z23 Encounter for immunization: Secondary | ICD-10-CM

## 2017-12-24 ENCOUNTER — Other Ambulatory Visit: Payer: Self-pay | Admitting: Family Medicine

## 2017-12-28 ENCOUNTER — Other Ambulatory Visit: Payer: Self-pay | Admitting: Family Medicine

## 2018-01-16 ENCOUNTER — Other Ambulatory Visit: Payer: Medicare Other

## 2018-01-16 DIAGNOSIS — E7849 Other hyperlipidemia: Secondary | ICD-10-CM

## 2018-01-16 DIAGNOSIS — I10 Essential (primary) hypertension: Secondary | ICD-10-CM | POA: Diagnosis not present

## 2018-01-16 DIAGNOSIS — R7309 Other abnormal glucose: Secondary | ICD-10-CM | POA: Diagnosis not present

## 2018-01-16 DIAGNOSIS — Z0000 Encounter for general adult medical examination without abnormal findings: Secondary | ICD-10-CM

## 2018-01-18 LAB — COMPREHENSIVE METABOLIC PANEL
AG RATIO: 1.4 (calc) (ref 1.0–2.5)
ALT: 44 U/L — ABNORMAL HIGH (ref 6–29)
AST: 41 U/L — AB (ref 10–35)
Albumin: 3.8 g/dL (ref 3.6–5.1)
Alkaline phosphatase (APISO): 87 U/L (ref 33–130)
BUN: 13 mg/dL (ref 7–25)
CO2: 29 mmol/L (ref 20–32)
CREATININE: 0.83 mg/dL (ref 0.50–0.99)
Calcium: 10.5 mg/dL — ABNORMAL HIGH (ref 8.6–10.4)
Chloride: 103 mmol/L (ref 98–110)
Globulin: 2.8 g/dL (calc) (ref 1.9–3.7)
Glucose, Bld: 111 mg/dL — ABNORMAL HIGH (ref 65–99)
Potassium: 4.2 mmol/L (ref 3.5–5.3)
SODIUM: 140 mmol/L (ref 135–146)
Total Bilirubin: 0.5 mg/dL (ref 0.2–1.2)
Total Protein: 6.6 g/dL (ref 6.1–8.1)

## 2018-01-18 LAB — CBC WITH DIFFERENTIAL/PLATELET
ABSOLUTE MONOCYTES: 559 {cells}/uL (ref 200–950)
BASOS PCT: 1.1 %
Basophils Absolute: 89 cells/uL (ref 0–200)
EOS ABS: 259 {cells}/uL (ref 15–500)
Eosinophils Relative: 3.2 %
HEMATOCRIT: 38.7 % (ref 35.0–45.0)
Hemoglobin: 12.8 g/dL (ref 11.7–15.5)
LYMPHS ABS: 3102 {cells}/uL (ref 850–3900)
MCH: 29.1 pg (ref 27.0–33.0)
MCHC: 33.1 g/dL (ref 32.0–36.0)
MCV: 88 fL (ref 80.0–100.0)
MPV: 12.5 fL (ref 7.5–12.5)
Monocytes Relative: 6.9 %
NEUTROS PCT: 50.5 %
Neutro Abs: 4091 cells/uL (ref 1500–7800)
Platelets: 284 10*3/uL (ref 140–400)
RBC: 4.4 10*6/uL (ref 3.80–5.10)
RDW: 12.4 % (ref 11.0–15.0)
Total Lymphocyte: 38.3 %
WBC: 8.1 10*3/uL (ref 3.8–10.8)

## 2018-01-18 LAB — TEST AUTHORIZATION

## 2018-01-18 LAB — LIPID PANEL
CHOL/HDL RATIO: 3.4 (calc) (ref <5.0)
Cholesterol: 208 mg/dL — ABNORMAL HIGH (ref <200)
HDL: 61 mg/dL (ref >50)
LDL CHOLESTEROL (CALC): 126 mg/dL — AB
NON-HDL CHOLESTEROL (CALC): 147 mg/dL — AB (ref <130)
Triglycerides: 100 mg/dL (ref <150)

## 2018-01-18 LAB — HEMOGLOBIN A1C W/OUT EAG: HEMOGLOBIN A1C: 6.4 %{Hb} — AB (ref <5.7)

## 2018-01-20 ENCOUNTER — Ambulatory Visit (INDEPENDENT_AMBULATORY_CARE_PROVIDER_SITE_OTHER): Payer: Medicare Other | Admitting: Family Medicine

## 2018-01-20 ENCOUNTER — Encounter: Payer: Self-pay | Admitting: Family Medicine

## 2018-01-20 ENCOUNTER — Encounter: Payer: Medicare Other | Admitting: Family Medicine

## 2018-01-20 VITALS — BP 130/84 | HR 62 | Resp 16 | Ht 66.0 in | Wt 221.0 lb

## 2018-01-20 DIAGNOSIS — R945 Abnormal results of liver function studies: Secondary | ICD-10-CM | POA: Diagnosis not present

## 2018-01-20 DIAGNOSIS — Z0000 Encounter for general adult medical examination without abnormal findings: Secondary | ICD-10-CM | POA: Diagnosis not present

## 2018-01-20 DIAGNOSIS — I10 Essential (primary) hypertension: Secondary | ICD-10-CM | POA: Diagnosis not present

## 2018-01-20 DIAGNOSIS — R7989 Other specified abnormal findings of blood chemistry: Secondary | ICD-10-CM

## 2018-01-20 NOTE — Progress Notes (Signed)
Subjective:    Patient ID: Kelli Terrell, female    DOB: 1950-02-15, 68 y.o.   MRN: [id number]  HPI Here for CPE.  Last colonoscopy was in 2013.  Mammogram is scheduled for later this month and DEXA which was excellent was 1/16.  Pneumovax, Tdap, and influenza shots are up to date.  Prevnar 13 is also UTD.  Zostavax given in 2010.  Patient denies any problems with falls.  She denies any problems with depression.  She denies any trouble with memory loss.  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  The gabapentin helps her significantly.  However she is actually taking 300 mg in the morning and 900 mg in the evening.  However she denies any side effects at this high dose.  She denies any dizziness.  It helps her sleep.  Her liver tests remain slightly elevated as evidenced below.  She denies any possible exposure to hepatitis.  I suspect fatty liver disease.  Lab work also shows a significant rise in her blood sugar. Immunization History  Administered Date(s) Administered  . Influenza, High Dose Seasonal PF 11/30/2017  . Influenza,inj,Quad PF,6+ Mos 11/23/2012, 12/20/2013, 11/01/2014, 01/08/2016, 10/21/2016  . Influenza-Unspecified 12/21/2010, 10/04/2011  . Pneumococcal Conjugate-13 12/27/2014  . Pneumococcal Polysaccharide-23 12/20/2013  . Tdap 12/14/2012    Lab on 01/16/2018  Component Date Value Ref Range Status  . WBC 01/16/2018 8.1  3.8 - 10.8 Thousand/uL Final  . RBC 01/16/2018 4.40  3.80 - 5.10 Million/uL Final  . Hemoglobin 01/16/2018 12.8  11.7 - 15.5 g/dL Final  . HCT 01/16/2018 38.7  35.0 - 45.0 % Final  . MCV 01/16/2018 88.0  80.0 - 100.0 fL Final  . MCH 01/16/2018 29.1  27.0 - 33.0 pg Final  . MCHC 01/16/2018 33.1  32.0 - 36.0 g/dL Final  . RDW 01/16/2018 12.4  11.0 - 15.0 % Final  . Platelets 01/16/2018 284  140 - 400 Thousand/uL Final  . MPV 01/16/2018 12.5  7.5 - 12.5 fL Final  . Neutro Abs 01/16/2018 4,091  1,500 - 7,800 cells/uL Final  . Lymphs Abs 01/16/2018  3,102  850 - 3,900 cells/uL Final  . Absolute Monocytes 01/16/2018 559  200 - 950 cells/uL Final  . Eosinophils Absolute 01/16/2018 259  15 - 500 cells/uL Final  . Basophils Absolute 01/16/2018 89  0 - 200 cells/uL Final  . Neutrophils Relative % 01/16/2018 50.5  % Final  . Total Lymphocyte 01/16/2018 38.3  % Final  . Monocytes Relative 01/16/2018 6.9  % Final  . Eosinophils Relative 01/16/2018 3.2  % Final  . Basophils Relative 01/16/2018 1.1  % Final  . Glucose, Bld 01/16/2018 111* 65 - 99 mg/dL Final   Comment: .            Fasting reference interval . For someone without known diabetes, a glucose value between 100 and 125 mg/dL is consistent with prediabetes and should be confirmed with a follow-up test. .   . BUN 01/16/2018 13  7 - 25 mg/dL Final  . Creat 01/16/2018 0.83  0.50 - 0.99 mg/dL Final   Comment: For patients >72 years of age, the reference limit for Creatinine is approximately 13% higher for people identified as African-American. .   Kelli Terrell Ratio 54/62/7035 NOT APPLICABLE  6 - 22 (calc) Final  . Sodium 01/16/2018 140  135 - 146 mmol/L Final  . Potassium 01/16/2018 4.2  3.5 - 5.3 mmol/L Final  . Chloride 01/16/2018 103  98 -  110 mmol/L Final  . CO2 01/16/2018 29  20 - 32 mmol/L Final  . Calcium 01/16/2018 10.5* 8.6 - 10.4 mg/dL Final  . Total Protein 01/16/2018 6.6  6.1 - 8.1 g/dL Final  . Albumin 01/16/2018 3.8  3.6 - 5.1 g/dL Final  . Globulin 01/16/2018 2.8  1.9 - 3.7 g/dL (calc) Final  . AG Ratio 01/16/2018 1.4  1.0 - 2.5 (calc) Final  . Total Bilirubin 01/16/2018 0.5  0.2 - 1.2 mg/dL Final  . Alkaline phosphatase (APISO) 01/16/2018 87  33 - 130 U/L Final  . AST 01/16/2018 41* 10 - 35 U/L Final  . ALT 01/16/2018 44* 6 - 29 U/L Final  . Cholesterol 01/16/2018 208* <200 mg/dL Final  . HDL 01/16/2018 61  >50 mg/dL Final  . Triglycerides 01/16/2018 100  <150 mg/dL Final  . LDL Cholesterol (Calc) 01/16/2018 126* mg/dL (calc) Final   Comment:  Reference range: <100 . Desirable range <100 mg/dL for primary prevention;   <70 mg/dL for patients with CHD or diabetic patients  with > or = 2 CHD risk factors. Marland Kitchen LDL-C is now calculated using the Kelli Terrell  calculation, which is a validated novel method providing  better accuracy than the Friedewald equation in the  estimation of LDL-C.  Kelli Terrell et al. Annamaria Helling. 1062;694(85): 2061-2068  (http://education.[facility].com/faq/FAQ164)   . Total CHOL/HDL Ratio 01/16/2018 3.4  <5.0 (calc) Final  . Non-HDL Cholesterol (Calc) 01/16/2018 147* <130 mg/dL (calc) Final   Comment: For patients with diabetes plus 1 major ASCVD risk  factor, treating to a non-HDL-C goal of <100 mg/dL  (LDL-C of <70 mg/dL) is considered a therapeutic  option.   . Hgb A1c MFr Bld 01/16/2018 6.4* <5.7 % of total Hgb Final   Comment: For someone without known diabetes, a hemoglobin  A1c value between 5.7% and 6.4% is consistent with prediabetes and should be confirmed with a  follow-up test. . For someone with known diabetes, a value <7% indicates that their diabetes is well controlled. A1c targets should be individualized based on duration of diabetes, age, comorbid conditions, and other considerations. . This assay result is consistent with an increased risk of diabetes. . Currently, no consensus exists regarding use of hemoglobin A1c for diagnosis of diabetes for children. .   . TEST NAME: 01/16/2018 HEMOGLOBIN A1c   Final  . TEST CODE: 01/16/2018 [id number]   Final  . CLIENT CONTACT: 01/16/2018 Kelli Terrell   Final  . REPORT ALWAYS MESSAGE SIGNATURE 01/16/2018    Final   Comment: . The laboratory testing on this patient was verbally requested or confirmed by the ordering physician or his or her authorized representative after contact with an employee of [employer]. Federal regulations require that we maintain on file written authorization for all laboratory testing.  Accordingly we are  asking that the ordering physician or his or her authorized representative sign a copy of this report and promptly return it to the client service representative. . . Signature:____________________________________________________ . Please fax this signed page to [phone] or return it via your [employer] courier.     Past Medical History:  Diagnosis Date  . 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 GERD (gastroesophageal reflux disease)   . Hyperlipidemia   . Hypertension   . Internal hemorrhoids    Past Surgical History:  Procedure Laterality Date  . CHOLECYSTECTOMY    . TONSILLECTOMY     Current Outpatient Medications on File Prior to Visit  Medication Sig Dispense  Refill  . amLODipine (NORVASC) 5 MG tablet TAKE 1 TABLET (5 MG TOTAL) BY MOUTH DAILY. 90 tablet 3  . aspirin 81 MG tablet Take 81 mg by mouth daily.    . B Complex CAPS Take by mouth.    . fluoruracil (CARAC) 0.5 % cream Apply topically daily. 30 g 0  . furosemide (LASIX) 40 MG tablet TAKE 1 TABLET (40 MG TOTAL) BY MOUTH DAILY. 30 tablet 5  . gabapentin (NEURONTIN) 300 MG capsule TAKE 2 CAPSULES (600 MG TOTAL) BY MOUTH 3 (THREE) TIMES DAILY. 540 capsule 2  . omeprazole (PRILOSEC) 40 MG capsule TAKE ONE CAPSULE BY MOUTH EVERY DAY (Patient taking differently: Take 40 mg by mouth every other day. ) 90 capsule 3   No current facility-administered medications on file prior to visit.    Allergies  Allergen Reactions  . Codeine Nausea And Vomiting   Social History   Socioeconomic History  . Marital status: Married    Spouse name: Not on file  . Number of children: Not on file  . Years of education: Not on file  . Highest education level: Not on file  Occupational History  . Not on file  Social Needs  . Financial resource strain: Not on file  . Food insecurity:    Worry: Not on file    Inability: Not on file  . Transportation needs:    Medical: Not on file     Non-medical: Not on file  Tobacco Use  . Smoking status: Never Smoker  . Smokeless tobacco: Never Used  Substance and Sexual Activity  . Alcohol use: Yes    Comment: Rare  . Drug use: No  . Sexual activity: Not on file  Lifestyle  . Physical activity:    Days per week: Not on file    Minutes per session: Not on file  . Stress: Not on file  Relationships  . Social connections:    Talks on phone: Not on file    Gets together: Not on file    Attends religious service: Not on file    Active member of club or organization: Not on file    Attends meetings of clubs or organizations: Not on file    Relationship status: Not on file  . Intimate partner violence:    Fear of current or ex partner: Not on file    Emotionally abused: Not on file    Physically abused: Not on file    Forced sexual activity: Not on file  Other Topics Concern  . Not on file  Social History Narrative   Married. Retired.    Never smoked.    Family History  Problem Relation Age of Onset  . Cancer Father        kidney  . Stroke Brother   . Cancer Brother        Thyroid Cancer  . Heart disease Brother      Review of Systems  All other systems reviewed and are negative.      Objective:   Physical Exam  Constitutional: She is oriented to person, place, and time. She appears well-developed and well-nourished. No distress.  HENT:  Head: Normocephalic and atraumatic.  Right Ear: External ear normal.  Left Ear: External ear normal.  Nose: Nose normal.  Mouth/Throat: Oropharynx is clear and moist. No oropharyngeal exudate.  Eyes: Pupils are equal, round, and reactive to light. Conjunctivae and EOM are normal. Right eye exhibits no discharge. Left eye exhibits no discharge. No  scleral icterus.  Neck: Normal range of motion. Neck supple. No JVD present. No tracheal deviation present. No thyromegaly present.  Cardiovascular: Normal rate, regular rhythm, normal heart sounds and intact distal pulses. Exam  reveals no gallop and no friction rub.  No murmur heard. Pulmonary/Chest: Effort normal and breath sounds normal. No stridor. No respiratory distress. She has no wheezes. She has no rales. She exhibits no tenderness.  Abdominal: Soft. Bowel sounds are normal. She exhibits no distension and no mass. There is no abdominal tenderness. There is no rebound and no guarding.  Musculoskeletal: Normal range of motion.        General: No tenderness or edema.  Lymphadenopathy:    She has no cervical adenopathy.  Neurological: She is alert and oriented to person, place, and time. She has normal reflexes. No cranial nerve deficit. She exhibits normal muscle tone. Coordination normal.  Skin: Skin is warm. No rash noted. She is not diaphoretic. No erythema. No pallor.  Psychiatric: She has a normal mood and affect. Her behavior is normal. Judgment and thought content normal.  Vitals reviewed.         Assessment & Plan:  Routine general medical examination at a health care facility  Essential hypertension  Blood pressure today is well controlled.  Given the rise in her blood sugar have recommended a low carbohydrate diet exercise and weight loss.  I would like to recheck blood sugar in 6 months.  Her calcium is slightly elevated and therefore I recommended the patient stop taking extra calcium and then recheck that in 6 months as well.  If persistently elevated I will check a PTH.  She will continue gabapentin for neuropathic pain in her feet.  At the present time she is tolerating the sciatica.  She denies problems with depression falls or memory loss.  Mammogram is scheduled for later this month.  Colonoscopy and bone density are up-to-date.  I will schedule the patient for a right upper quadrant ultrasound given her elevated liver function test but I suspect fatty liver disease.

## 2018-01-23 ENCOUNTER — Ambulatory Visit: Payer: Medicare Other

## 2018-01-23 VITALS — BP 120/78 | HR 62

## 2018-01-23 DIAGNOSIS — Z0130 Encounter for examination of blood pressure without abnormal findings: Secondary | ICD-10-CM

## 2018-01-23 NOTE — Progress Notes (Signed)
Patient came in for me to check her blood pressure against her electronic blood pressure cuff that she uses at home. Patient reading with her electronic cuff was 128/83 with a pulse of 65. The manual blood pressure reading that I got was 120/78 with a pulse of 62.

## 2018-01-25 ENCOUNTER — Ambulatory Visit
Admission: RE | Admit: 2018-01-25 | Discharge: 2018-01-25 | Disposition: A | Discharge status: Home / Self Care | Payer: Medicare Other | Source: Ambulatory Visit | Attending: Family Medicine | Admitting: Family Medicine

## 2018-01-25 DIAGNOSIS — K7689 Other specified diseases of liver: Secondary | ICD-10-CM | POA: Diagnosis not present

## 2018-01-25 DIAGNOSIS — R945 Abnormal results of liver function studies: Principal | ICD-10-CM

## 2018-01-25 DIAGNOSIS — R7989 Other specified abnormal findings of blood chemistry: Secondary | ICD-10-CM

## 2018-02-01 DIAGNOSIS — H2513 Age-related nuclear cataract, bilateral: Secondary | ICD-10-CM | POA: Diagnosis not present

## 2018-02-02 DIAGNOSIS — Z1231 Encounter for screening mammogram for malignant neoplasm of breast: Secondary | ICD-10-CM | POA: Diagnosis not present

## 2018-02-02 LAB — HM MAMMOGRAPHY

## 2018-02-13 ENCOUNTER — Encounter: Payer: Self-pay | Admitting: *Deleted

## 2018-03-24 ENCOUNTER — Other Ambulatory Visit: Payer: Self-pay | Admitting: Family Medicine

## 2018-05-04 DIAGNOSIS — D225 Melanocytic nevi of trunk: Secondary | ICD-10-CM | POA: Diagnosis not present

## 2018-05-04 DIAGNOSIS — Z85828 Personal history of other malignant neoplasm of skin: Secondary | ICD-10-CM | POA: Diagnosis not present

## 2018-05-04 DIAGNOSIS — L570 Actinic keratosis: Secondary | ICD-10-CM | POA: Diagnosis not present

## 2018-05-04 DIAGNOSIS — D2261 Melanocytic nevi of right upper limb, including shoulder: Secondary | ICD-10-CM | POA: Diagnosis not present

## 2018-05-04 DIAGNOSIS — L821 Other seborrheic keratosis: Secondary | ICD-10-CM | POA: Diagnosis not present

## 2018-05-04 DIAGNOSIS — D2262 Melanocytic nevi of left upper limb, including shoulder: Secondary | ICD-10-CM | POA: Diagnosis not present

## 2018-06-26 ENCOUNTER — Other Ambulatory Visit: Payer: Self-pay | Admitting: Family Medicine

## 2018-08-31 ENCOUNTER — Other Ambulatory Visit: Payer: Self-pay | Admitting: Family Medicine

## 2018-09-21 ENCOUNTER — Other Ambulatory Visit: Payer: Self-pay | Admitting: Family Medicine

## 2018-09-27 ENCOUNTER — Other Ambulatory Visit: Payer: Self-pay

## 2018-09-27 ENCOUNTER — Ambulatory Visit (INDEPENDENT_AMBULATORY_CARE_PROVIDER_SITE_OTHER): Payer: Medicare Other | Admitting: Family Medicine

## 2018-09-27 DIAGNOSIS — Z23 Encounter for immunization: Secondary | ICD-10-CM

## 2018-12-30 ENCOUNTER — Other Ambulatory Visit: Payer: Self-pay | Admitting: Family Medicine

## 2019-01-22 ENCOUNTER — Other Ambulatory Visit: Payer: Medicare Other

## 2019-01-22 ENCOUNTER — Other Ambulatory Visit: Payer: Self-pay

## 2019-01-22 DIAGNOSIS — Z0000 Encounter for general adult medical examination without abnormal findings: Secondary | ICD-10-CM | POA: Diagnosis not present

## 2019-01-22 DIAGNOSIS — R7989 Other specified abnormal findings of blood chemistry: Secondary | ICD-10-CM | POA: Diagnosis not present

## 2019-01-22 DIAGNOSIS — I10 Essential (primary) hypertension: Secondary | ICD-10-CM

## 2019-01-23 LAB — CBC WITH DIFFERENTIAL/PLATELET
Absolute Monocytes: 669 cells/uL (ref 200–950)
Basophils Absolute: 49 cells/uL (ref 0–200)
Basophils Relative: 0.5 %
Eosinophils Absolute: 233 cells/uL (ref 15–500)
Eosinophils Relative: 2.4 %
HCT: 39.8 % (ref 35.0–45.0)
Hemoglobin: 13 g/dL (ref 11.7–15.5)
Lymphs Abs: 2774 cells/uL (ref 850–3900)
MCH: 28.1 pg (ref 27.0–33.0)
MCHC: 32.7 g/dL (ref 32.0–36.0)
MCV: 86.1 fL (ref 80.0–100.0)
MPV: 12 fL (ref 7.5–12.5)
Monocytes Relative: 6.9 %
Neutro Abs: 5975 cells/uL (ref 1500–7800)
Neutrophils Relative %: 61.6 %
Platelets: 363 10*3/uL (ref 140–400)
RBC: 4.62 10*6/uL (ref 3.80–5.10)
RDW: 12.3 % (ref 11.0–15.0)
Total Lymphocyte: 28.6 %
WBC: 9.7 10*3/uL (ref 3.8–10.8)

## 2019-01-23 LAB — COMPLETE METABOLIC PANEL WITH GFR
AG Ratio: 1.3 (calc) (ref 1.0–2.5)
ALT: 13 U/L (ref 6–29)
AST: 14 U/L (ref 10–35)
Albumin: 4 g/dL (ref 3.6–5.1)
Alkaline phosphatase (APISO): 94 U/L (ref 37–153)
BUN: 14 mg/dL (ref 7–25)
CO2: 23 mmol/L (ref 20–32)
Calcium: 10.5 mg/dL — ABNORMAL HIGH (ref 8.6–10.4)
Chloride: 103 mmol/L (ref 98–110)
Creat: 0.85 mg/dL (ref 0.60–0.93)
GFR, Est African American: 80 mL/min/{1.73_m2} (ref >=60)
GFR, Est Non African American: 69 mL/min/{1.73_m2} (ref >=60)
Globulin: 3 g/dL (calc) (ref 1.9–3.7)
Glucose, Bld: 96 mg/dL (ref 65–99)
Potassium: 4.3 mmol/L (ref 3.5–5.3)
Sodium: 139 mmol/L (ref 135–146)
Total Bilirubin: 0.4 mg/dL (ref 0.2–1.2)
Total Protein: 7 g/dL (ref 6.1–8.1)

## 2019-01-23 LAB — LIPID PANEL
Cholesterol: 204 mg/dL — ABNORMAL HIGH (ref <200)
HDL: 48 mg/dL — ABNORMAL LOW (ref >=50)
LDL Cholesterol (Calc): 131 mg/dL (calc) — ABNORMAL HIGH
Non-HDL Cholesterol (Calc): 156 mg/dL (calc) — ABNORMAL HIGH (ref <130)
Total CHOL/HDL Ratio: 4.3 (calc) (ref <5.0)
Triglycerides: 140 mg/dL (ref <150)

## 2019-01-23 LAB — HEMOGLOBIN A1C
Hgb A1c MFr Bld: 6.5 % of total Hgb — ABNORMAL HIGH (ref <5.7)
Mean Plasma Glucose: 140 (calc)
eAG (mmol/L): 7.7 (calc)

## 2019-01-25 ENCOUNTER — Ambulatory Visit (INDEPENDENT_AMBULATORY_CARE_PROVIDER_SITE_OTHER): Payer: Medicare Other | Admitting: Family Medicine

## 2019-01-25 ENCOUNTER — Other Ambulatory Visit: Payer: Self-pay

## 2019-01-25 VITALS — BP 130/78 | HR 76 | Temp 96.9°F | Resp 14 | Ht 66.0 in | Wt 215.0 lb

## 2019-01-25 DIAGNOSIS — Z0001 Encounter for general adult medical examination with abnormal findings: Secondary | ICD-10-CM | POA: Diagnosis not present

## 2019-01-25 DIAGNOSIS — R7989 Other specified abnormal findings of blood chemistry: Secondary | ICD-10-CM | POA: Diagnosis not present

## 2019-01-25 DIAGNOSIS — Z0000 Encounter for general adult medical examination without abnormal findings: Secondary | ICD-10-CM

## 2019-01-25 DIAGNOSIS — I10 Essential (primary) hypertension: Secondary | ICD-10-CM

## 2019-01-25 MED ORDER — MELOXICAM 15 MG PO TABS: 15.0000 mg | ORAL_TABLET | Freq: Every day | ORAL | 11 refills | Status: DC

## 2019-01-25 MED ORDER — MOMETASONE FUROATE 0.1 % EX CREA: 1.0000 "application " | TOPICAL_CREAM | Freq: Every day | CUTANEOUS | 0 refills | Status: DC

## 2019-01-25 NOTE — Progress Notes (Signed)
Subjective:    Patient ID: Kelli Terrell, female    DOB: 11-13-1950, 69 y.o.   MRN: [id number]  HPI Here for CPE.  Since I last saw the patient, she has discontinued drinking [employer]eps[employer].  Her liver function test have improved dramatically.  Her fasting blood sugar is also now down to 96.  I am very proud of her for doing that.  Her hemoglobin A1c remains stable at 6.5.  She is due for a mammogram but she is already scheduled for this.  Her last colonoscopy was 2018 and is not due again until 2023.  She is due for a bone density this year but she prefers to wait until next year due to COVID-19.  She is not required to have a Pap smear given her age.  Her immunizations are up-to-date except for COVID-19 vaccination which we discussed today at length.  She does continue to complain of right-sided sciatica.  Please see her MRI report from 2018 which shows multilevel degenerative disc disease with right sided nerve impingement at L3-L4 L4-L5 and L5-S1.  She declines referral for epidural steroid injections but would like to try an NSAID so I have given the patient meloxicam in addition to her gabapentin. Immunization History  Administered Date(s) Administered  . Fluad Quad(high Dose 65+) 09/27/2018  . Influenza, High Dose Seasonal PF 11/30/2017  . Influenza,inj,Quad PF,6+ Mos 11/23/2012, 12/20/2013, 11/01/2014, 01/08/2016, 10/21/2016  . Influenza-Unspecified 12/21/2010, 10/04/2011  . Pneumococcal Conjugate-13 12/27/2014  . Pneumococcal Polysaccharide-23 12/20/2013  . Tdap 12/14/2012    Lab on 01/22/2019  Component Date Value Ref Range Status  . WBC 01/22/2019 9.7  3.8 - 10.8 Thousand/uL Final  . RBC 01/22/2019 4.62  3.80 - 5.10 Million/uL Final  . Hemoglobin 01/22/2019 13.0  11.7 - 15.5 g/dL Final  . HCT 01/22/2019 39.8  35.0 - 45.0 % Final  . MCV 01/22/2019 86.1  80.0 - 100.0 fL Final  . MCH 01/22/2019 28.1  27.0 - 33.0 pg Final  . MCHC 01/22/2019 32.7  32.0 - 36.0 g/dL Final  . RDW 01/22/2019  12.3  11.0 - 15.0 % Final  . Platelets 01/22/2019 363  140 - 400 Thousand/uL Final  . MPV 01/22/2019 12.0  7.5 - 12.5 fL Final  . Neutro Abs 01/22/2019 5,975  1,500 - 7,800 cells/uL Final  . Lymphs Abs 01/22/2019 2,774  850 - 3,900 cells/uL Final  . Absolute Monocytes 01/22/2019 669  200 - 950 cells/uL Final  . Eosinophils Absolute 01/22/2019 233  15 - 500 cells/uL Final  . Basophils Absolute 01/22/2019 49  0 - 200 cells/uL Final  . Neutrophils Relative % 01/22/2019 61.6  % Final  . Total Lymphocyte 01/22/2019 28.6  % Final  . Monocytes Relative 01/22/2019 6.9  % Final  . Eosinophils Relative 01/22/2019 2.4  % Final  . Basophils Relative 01/22/2019 0.5  % Final  . Glucose, Bld 01/22/2019 96  65 - 99 mg/dL Final   Comment: .            Fasting reference interval .   . BUN 01/22/2019 14  7 - 25 mg/dL Final  . Creat 01/22/2019 0.85  0.60 - 0.93 mg/dL Final   Comment: For patients >24 years of age, the reference limit for Creatinine is approximately 13% higher for people identified as African-American. .   . GFR, Est Non African American 01/22/2019 69  > OR = 60 mL/min/1.66m2 Final  . GFR, Est African American 01/22/2019 80  > OR = 60  mL/min/1.8m2 Final  . BUN/Creatinine Ratio [id number] NOT APPLICABLE  6 - 22 (calc) Final  . Sodium 01/22/2019 139  135 - 146 mmol/L Final  . Potassium 01/22/2019 4.3  3.5 - 5.3 mmol/L Final  . Chloride 01/22/2019 103  98 - 110 mmol/L Final  . CO2 01/22/2019 23  20 - 32 mmol/L Final  . Calcium 01/22/2019 10.5* 8.6 - 10.4 mg/dL Final  . Total Protein 01/22/2019 7.0  6.1 - 8.1 g/dL Final  . Albumin 01/22/2019 4.0  3.6 - 5.1 g/dL Final  . Globulin 01/22/2019 3.0  1.9 - 3.7 g/dL (calc) Final  . AG Ratio 01/22/2019 1.3  1.0 - 2.5 (calc) Final  . Total Bilirubin 01/22/2019 0.4  0.2 - 1.2 mg/dL Final  . Alkaline phosphatase (APISO) 01/22/2019 94  37 - 153 U/L Final  . AST 01/22/2019 14  10 - 35 U/L Final  . ALT 01/22/2019 13  6 - 29 U/L Final  .  Cholesterol 01/22/2019 204* <200 mg/dL Final  . HDL 01/22/2019 48* > OR = 50 mg/dL Final  . Triglycerides 01/22/2019 140  <150 mg/dL Final  . LDL Cholesterol (Calc) 01/22/2019 131* mg/dL (calc) Final   Comment: Reference range: <100 . Desirable range <100 mg/dL for primary prevention;   <70 mg/dL for patients with CHD or diabetic patients  with > or = 2 CHD risk factors. Marland Kitchen LDL-C is now calculated using the Martin-Hopkins  calculation, which is a validated novel method providing  better accuracy than the Friedewald equation in the  estimation of LDL-C.  Cresenciano Genre et al. Annamaria Helling. [id number]): 2061-2068  (http://education.[facility].com/faq/FAQ164)   . Total CHOL/HDL Ratio 01/22/2019 4.3  <5.0 (calc) Final  . Non-HDL Cholesterol (Calc) 01/22/2019 156* <130 mg/dL (calc) Final   Comment: For patients with diabetes plus 1 major ASCVD risk  factor, treating to a non-HDL-C goal of <100 mg/dL  (LDL-C of <70 mg/dL) is considered a therapeutic  option.   . Hgb A1c MFr Bld 01/22/2019 6.5* <5.7 % of total Hgb Final   Comment: For someone without known diabetes, a hemoglobin A1c value of 6.5% or greater indicates that they may have  diabetes and this should be confirmed with a follow-up  test. . For someone with known diabetes, a value <7% indicates  that their diabetes is well controlled and a value  greater than or equal to 7% indicates suboptimal  control. A1c targets should be individualized based on  duration of diabetes, age, comorbid conditions, and  other considerations. . Currently, no consensus exists regarding use of hemoglobin A1c for diagnosis of diabetes for children. .   . Mean Plasma Glucose 01/22/2019 140  (calc) Final  . eAG (mmol/L) 01/22/2019 7.7  (calc) Final    Past Medical History:  Diagnosis Date  . 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 GERD (gastroesophageal reflux disease)   . Hyperlipidemia   . Hypertension   . Internal  hemorrhoids    Past Surgical History:  Procedure Laterality Date  . CHOLECYSTECTOMY    . TONSILLECTOMY     Current Outpatient Medications on File Prior to Visit  Medication Sig Dispense Refill  . amLODipine (NORVASC) 5 MG tablet TAKE 1 TABLET BY MOUTH EVERY DAY 90 tablet 3  . aspirin 81 MG tablet Take 81 mg by mouth daily.    . B Complex CAPS Take by mouth.    . Cholecalciferol (VITAMIN D) 50 MCG (2000 UT) tablet Take 2,000 Units by mouth daily.    Marland Kitchen  gabapentin (NEURONTIN) 300 MG capsule TAKE 2 CAPSULES (600 MG TOTAL) BY MOUTH 3 (THREE) TIMES DAILY. 540 capsule 2  . hydrochlorothiazide (HYDRODIURIL) 25 MG tablet TAKE 1 TABLET BY MOUTH EVERY DAY 90 tablet 3  . omeprazole (PRILOSEC) 40 MG capsule TAKE ONE CAPSULE BY MOUTH EVERY DAY 90 capsule 3  . zinc gluconate 50 MG tablet Take 50 mg by mouth daily.     No current facility-administered medications on file prior to visit.   Allergies  Allergen Reactions  . Codeine Nausea And Vomiting   Social History   Socioeconomic History  . Marital status: Married    Spouse name: Not on file  . Number of children: Not on file  . Years of education: Not on file  . Highest education level: Not on file  Occupational History  . Not on file  Tobacco Use  . Smoking status: Never Smoker  . Smokeless tobacco: Never Used  Substance and Sexual Activity  . Alcohol use: Yes    Comment: Rare  . Drug use: No  . Sexual activity: Not on file  Other Topics Concern  . Not on file  Social History Narrative   Married. Retired.    Never smoked.    Social Determinants of Health   Financial Resource Strain:   . Difficulty of Paying Living Expenses: Not on file  Food Insecurity:   . Worried About Charity fundraiser in the Last Year: Not on file  . Ran Out of Food in the Last Year: Not on file  Transportation Needs:   . Lack of Transportation (Medical): Not on file  . Lack of Transportation (Non-Medical): Not on file  Physical Activity:   . Days  of Exercise per Week: Not on file  . Minutes of Exercise per Session: Not on file  Stress:   . Feeling of Stress : Not on file  Social Connections:   . Frequency of Communication with Friends and Family: Not on file  . Frequency of Social Gatherings with Friends and Family: Not on file  . Attends Religious Services: Not on file  . Active Member of Clubs or Organizations: Not on file  . Attends Archivist Meetings: Not on file  . Marital Status: Not on file  Intimate Partner Violence:   . Fear of Current or Ex-Partner: Not on file  . Emotionally Abused: Not on file  . Physically Abused: Not on file  . Sexually Abused: Not on file   Family History  Problem Relation Age of Onset  . Cancer Father        kidney  . Stroke Brother   . Cancer Brother        Thyroid Cancer  . Heart disease Brother      Review of Systems  All other systems reviewed and are negative.      Objective:   Physical Exam  Constitutional: She is oriented to person, place, and time. She appears well-developed and well-nourished. No distress.  HENT:  Head: Normocephalic and atraumatic.  Right Ear: External ear normal.  Left Ear: External ear normal.  Nose: Nose normal.  Mouth/Throat: Oropharynx is clear and moist. No oropharyngeal exudate.  Eyes: Pupils are equal, round, and reactive to light. Conjunctivae and EOM are normal. Right eye exhibits no discharge. Left eye exhibits no discharge. No scleral icterus.  Neck: No JVD present. No tracheal deviation present. No thyromegaly present.  Cardiovascular: Normal rate, regular rhythm, normal heart sounds and intact distal pulses. Exam reveals  no gallop and no friction rub.  No murmur heard. Pulmonary/Chest: Effort normal and breath sounds normal. No stridor. No respiratory distress. She has no wheezes. She has no rales. She exhibits no tenderness.  Abdominal: Soft. Bowel sounds are normal. She exhibits no distension and no mass. There is no abdominal  tenderness. There is no rebound and no guarding.  Musculoskeletal:        General: No tenderness or edema. Normal range of motion.     Cervical back: Normal range of motion and neck supple.  Lymphadenopathy:    She has no cervical adenopathy.  Neurological: She is alert and oriented to person, place, and time. She has normal reflexes. No cranial nerve deficit. She exhibits normal muscle tone. Coordination normal.  Skin: Skin is warm. No rash noted. She is not diaphoretic. No erythema. No pallor.  Psychiatric: She has a normal mood and affect. Her behavior is normal. Judgment and thought content normal.  Vitals reviewed.         Assessment & Plan:  Routine general medical examination at a health care facility  Essential hypertension  Elevated LFTs  Blood pressures are well controlled.  Liver function test has normalized.  Patient is a borderline diabetic diet controlled.  Continue to encourage aerobic exercise and weight loss if possible.  Offered the patient oral steroid injection but she elects to try meloxicam 15 mg a day instead in addition to her gabapentin.  Immunizations and cancer screening are otherwise up-to-date.  We discussed COVID-19 vaccination.  She denies any issues with falls, depression, or memory loss.

## 2019-02-05 ENCOUNTER — Inpatient Hospital Stay (HOSPITAL_COMMUNITY)
Admission: EM | Admit: 2019-02-05 | Discharge: 2019-02-08 | DRG: 178 | Disposition: A | Discharge status: Home / Self Care | Payer: Medicare Other | Attending: Internal Medicine | Admitting: Internal Medicine

## 2019-02-05 ENCOUNTER — Encounter: Payer: Self-pay | Admitting: Family Medicine

## 2019-02-05 ENCOUNTER — Emergency Department (HOSPITAL_COMMUNITY): Payer: Medicare Other

## 2019-02-05 ENCOUNTER — Ambulatory Visit (INDEPENDENT_AMBULATORY_CARE_PROVIDER_SITE_OTHER): Payer: Medicare Other | Admitting: Family Medicine

## 2019-02-05 ENCOUNTER — Other Ambulatory Visit: Payer: Self-pay

## 2019-02-05 ENCOUNTER — Encounter (HOSPITAL_COMMUNITY): Payer: Self-pay

## 2019-02-05 VITALS — BP 110/64 | HR 90 | Temp 97.3°F | Resp 22 | Ht 66.0 in

## 2019-02-05 DIAGNOSIS — Z9049 Acquired absence of other specified parts of digestive tract: Secondary | ICD-10-CM | POA: Diagnosis not present

## 2019-02-05 DIAGNOSIS — Z8249 Family history of ischemic heart disease and other diseases of the circulatory system: Secondary | ICD-10-CM | POA: Diagnosis not present

## 2019-02-05 DIAGNOSIS — Z885 Allergy status to narcotic agent status: Secondary | ICD-10-CM | POA: Diagnosis not present

## 2019-02-05 DIAGNOSIS — D508 Other iron deficiency anemias: Secondary | ICD-10-CM

## 2019-02-05 DIAGNOSIS — D6959 Other secondary thrombocytopenia: Secondary | ICD-10-CM | POA: Diagnosis present

## 2019-02-05 DIAGNOSIS — K921 Melena: Secondary | ICD-10-CM | POA: Diagnosis not present

## 2019-02-05 DIAGNOSIS — Z823 Family history of stroke: Secondary | ICD-10-CM

## 2019-02-05 DIAGNOSIS — I10 Essential (primary) hypertension: Secondary | ICD-10-CM | POA: Diagnosis not present

## 2019-02-05 DIAGNOSIS — Z79899 Other long term (current) drug therapy: Secondary | ICD-10-CM | POA: Diagnosis not present

## 2019-02-05 DIAGNOSIS — E785 Hyperlipidemia, unspecified: Secondary | ICD-10-CM | POA: Diagnosis not present

## 2019-02-05 DIAGNOSIS — U071 COVID-19: Secondary | ICD-10-CM | POA: Diagnosis not present

## 2019-02-05 DIAGNOSIS — D649 Anemia, unspecified: Secondary | ICD-10-CM

## 2019-02-05 DIAGNOSIS — K219 Gastro-esophageal reflux disease without esophagitis: Secondary | ICD-10-CM | POA: Diagnosis not present

## 2019-02-05 DIAGNOSIS — Z808 Family history of malignant neoplasm of other organs or systems: Secondary | ICD-10-CM

## 2019-02-05 DIAGNOSIS — R233 Spontaneous ecchymoses: Secondary | ICD-10-CM | POA: Diagnosis not present

## 2019-02-05 DIAGNOSIS — R238 Other skin changes: Secondary | ICD-10-CM | POA: Diagnosis present

## 2019-02-05 DIAGNOSIS — R195 Other fecal abnormalities: Secondary | ICD-10-CM | POA: Diagnosis not present

## 2019-02-05 DIAGNOSIS — D62 Acute posthemorrhagic anemia: Secondary | ICD-10-CM | POA: Diagnosis present

## 2019-02-05 DIAGNOSIS — D696 Thrombocytopenia, unspecified: Secondary | ICD-10-CM

## 2019-02-05 DIAGNOSIS — Z7982 Long term (current) use of aspirin: Secondary | ICD-10-CM | POA: Diagnosis not present

## 2019-02-05 DIAGNOSIS — D693 Immune thrombocytopenic purpura: Secondary | ICD-10-CM | POA: Diagnosis not present

## 2019-02-05 LAB — COMPLETE METABOLIC PANEL WITH GFR
AG Ratio: 1.3 (calc) (ref 1.0–2.5)
ALT: 18 U/L (ref 6–29)
AST: 19 U/L (ref 10–35)
Albumin: 3.7 g/dL (ref 3.6–5.1)
Alkaline phosphatase (APISO): 73 U/L (ref 37–153)
BUN/Creatinine Ratio: 25 (calc) — ABNORMAL HIGH (ref 6–22)
BUN: 24 mg/dL (ref 7–25)
CO2: 25 mmol/L (ref 20–32)
Calcium: 10.1 mg/dL (ref 8.6–10.4)
Chloride: 104 mmol/L (ref 98–110)
Creat: 0.97 mg/dL — ABNORMAL HIGH (ref 0.60–0.93)
GFR, Est African American: 69 mL/min/{1.73_m2} (ref >=60)
GFR, Est Non African American: 59 mL/min/{1.73_m2} — ABNORMAL LOW (ref >=60)
Globulin: 2.8 g/dL (calc) (ref 1.9–3.7)
Glucose, Bld: 158 mg/dL — ABNORMAL HIGH (ref 65–99)
Potassium: 3.8 mmol/L (ref 3.5–5.3)
Sodium: 140 mmol/L (ref 135–146)
Total Bilirubin: 0.4 mg/dL (ref 0.2–1.2)
Total Protein: 6.5 g/dL (ref 6.1–8.1)

## 2019-02-05 LAB — RESPIRATORY PANEL BY RT PCR (FLU A&B, COVID)
Influenza A by PCR: NEGATIVE
Influenza B by PCR: NEGATIVE
SARS Coronavirus 2 by RT PCR: POSITIVE — AB

## 2019-02-05 LAB — CBC WITH DIFFERENTIAL/PLATELET
Absolute Monocytes: 793 cells/uL (ref 200–950)
Basophils Absolute: 82 cells/uL (ref 0–200)
Basophils Relative: 0.8 %
Eosinophils Absolute: 299 cells/uL (ref 15–500)
Eosinophils Relative: 2.9 %
HCT: 31.6 % — ABNORMAL LOW (ref 35.0–45.0)
Hemoglobin: 10.5 g/dL — ABNORMAL LOW (ref 11.7–15.5)
Lymphs Abs: 3718 cells/uL (ref 850–3900)
MCH: 28.5 pg (ref 27.0–33.0)
MCHC: 33.2 g/dL (ref 32.0–36.0)
MCV: 85.6 fL (ref 80.0–100.0)
MPV: 10 fL (ref 7.5–12.5)
Monocytes Relative: 7.7 %
Neutro Abs: 5408 cells/uL (ref 1500–7800)
Neutrophils Relative %: 52.5 %
Platelets: 2 10*3/uL — CL (ref 140–400)
RBC: 3.69 10*6/uL — ABNORMAL LOW (ref 3.80–5.10)
RDW: 12.8 % (ref 11.0–15.0)
Total Lymphocyte: 36.1 %
WBC: 10.3 10*3/uL (ref 3.8–10.8)

## 2019-02-05 LAB — CBC
HCT: 31.8 % — ABNORMAL LOW (ref 36.0–46.0)
Hemoglobin: 10.5 g/dL — ABNORMAL LOW (ref 12.0–15.0)
MCH: 28.8 pg (ref 26.0–34.0)
MCHC: 33 g/dL (ref 30.0–36.0)
MCV: 87.4 fL (ref 80.0–100.0)
Platelets: 1 10*3/uL — CL (ref 150–400)
RBC: 3.64 MIL/uL — ABNORMAL LOW (ref 3.87–5.11)
RDW: 13.4 % (ref 11.5–15.5)
WBC: 14.6 10*3/uL — ABNORMAL HIGH (ref 4.0–10.5)
nRBC: 0 % (ref 0.0–0.2)

## 2019-02-05 LAB — COMPREHENSIVE METABOLIC PANEL
ALT: 25 U/L (ref 0–44)
AST: 29 U/L (ref 15–41)
Albumin: 3.7 g/dL (ref 3.5–5.0)
Alkaline Phosphatase: 69 U/L (ref 38–126)
Anion gap: 10 (ref 5–15)
BUN: 28 mg/dL — ABNORMAL HIGH (ref 8–23)
CO2: 25 mmol/L (ref 22–32)
Calcium: 10.5 mg/dL — ABNORMAL HIGH (ref 8.9–10.3)
Chloride: 104 mmol/L (ref 98–111)
Creatinine, Ser: 1.09 mg/dL — ABNORMAL HIGH (ref 0.44–1.00)
GFR calc Af Amer: 60 mL/min — ABNORMAL LOW (ref >60)
GFR calc non Af Amer: 51 mL/min — ABNORMAL LOW (ref >60)
Glucose, Bld: 147 mg/dL — ABNORMAL HIGH (ref 70–99)
Potassium: 3.5 mmol/L (ref 3.5–5.1)
Sodium: 139 mmol/L (ref 135–145)
Total Bilirubin: 0.5 mg/dL (ref 0.3–1.2)
Total Protein: 7.4 g/dL (ref 6.5–8.1)

## 2019-02-05 LAB — ABO/RH: ABO/RH(D): A NEG

## 2019-02-05 LAB — RETICULOCYTES
Immature Retic Fract: 26.5 % — ABNORMAL HIGH (ref 2.3–15.9)
RBC.: 3.65 MIL/uL — ABNORMAL LOW (ref 3.87–5.11)
Retic Count, Absolute: 72.3 10*3/uL (ref 19.0–186.0)
Retic Ct Pct: 2 % (ref 0.4–3.1)

## 2019-02-05 LAB — HEMOGLOBIN, FINGERSTICK: POC HEMOGLOBIN: 10.7 g/dL — ABNORMAL LOW (ref 12.0–15.0)

## 2019-02-05 LAB — PROTIME-INR
INR: 1.1 (ref 0.8–1.2)
Prothrombin Time: 14.4 seconds (ref 11.4–15.2)

## 2019-02-05 LAB — LACTATE DEHYDROGENASE: LDH: 144 U/L (ref 98–192)

## 2019-02-05 MED ORDER — AMLODIPINE BESYLATE 5 MG PO TABS
5.0000 mg | ORAL_TABLET | Freq: Every day | ORAL | Status: DC
  Administered 2019-02-06 – 2019-02-08 (×3): 5 mg via ORAL
  Filled 2019-02-05 (×4): qty 1

## 2019-02-05 MED ORDER — ZINC SULFATE 220 (50 ZN) MG PO CAPS
220.0000 mg | ORAL_CAPSULE | Freq: Every day | ORAL | Status: DC
  Administered 2019-02-06 – 2019-02-08 (×3): 220 mg via ORAL
  Filled 2019-02-05 (×3): qty 1

## 2019-02-05 MED ORDER — SODIUM CHLORIDE 0.9% IV SOLUTION: Freq: Once | INTRAVENOUS | Status: AC

## 2019-02-05 MED ORDER — IPRATROPIUM-ALBUTEROL 20-100 MCG/ACT IN AERS
1.0000 | INHALATION_SPRAY | Freq: Four times a day (QID) | RESPIRATORY_TRACT | Status: DC
  Administered 2019-02-05 – 2019-02-07 (×7): 1 via RESPIRATORY_TRACT
  Filled 2019-02-05: qty 4

## 2019-02-05 MED ORDER — SENNOSIDES-DOCUSATE SODIUM 8.6-50 MG PO TABS: 1.0000 | ORAL_TABLET | Freq: Every evening | ORAL | Status: DC | PRN

## 2019-02-05 MED ORDER — SODIUM CHLORIDE 0.9 % IV BOLUS
1000.0000 mL | Freq: Once | INTRAVENOUS | Status: AC
  Administered 2019-02-05: 1000 mL via INTRAVENOUS

## 2019-02-05 MED ORDER — PANTOPRAZOLE SODIUM 40 MG PO TBEC
40.0000 mg | DELAYED_RELEASE_TABLET | Freq: Every day | ORAL | Status: DC
  Administered 2019-02-05 – 2019-02-08 (×4): 40 mg via ORAL
  Filled 2019-02-05 (×4): qty 1

## 2019-02-05 MED ORDER — IMMUNE GLOBULIN (HUMAN) 20 GM/200ML IV SOLN
1.0000 g/kg | INTRAVENOUS | Status: AC
  Administered 2019-02-05 – 2019-02-06 (×2): 100 g via INTRAVENOUS
  Filled 2019-02-05 (×2): qty 1000

## 2019-02-05 MED ORDER — GABAPENTIN 300 MG PO CAPS
600.0000 mg | ORAL_CAPSULE | Freq: Two times a day (BID) | ORAL | Status: DC
  Administered 2019-02-05 – 2019-02-08 (×6): 600 mg via ORAL
  Filled 2019-02-05 (×7): qty 2

## 2019-02-05 MED ORDER — GUAIFENESIN-DM 100-10 MG/5ML PO SYRP: 10.0000 mL | ORAL_SOLUTION | ORAL | Status: DC | PRN

## 2019-02-05 MED ORDER — PANTOPRAZOLE SODIUM 40 MG IV SOLR
40.0000 mg | Freq: Once | INTRAVENOUS | Status: AC
  Administered 2019-02-05: 15:00:00 40 mg via INTRAVENOUS
  Filled 2019-02-05: qty 40

## 2019-02-05 MED ORDER — ASCORBIC ACID 500 MG PO TABS
500.0000 mg | ORAL_TABLET | Freq: Every day | ORAL | Status: DC
  Administered 2019-02-05 – 2019-02-08 (×4): 500 mg via ORAL
  Filled 2019-02-05 (×4): qty 1

## 2019-02-05 MED ORDER — SODIUM CHLORIDE 0.9 % IV SOLN: INTRAVENOUS | Status: DC

## 2019-02-05 NOTE — Progress Notes (Signed)
Subjective:    Patient ID: Kelli Terrell, female    DOB: Oct 22, 1950, 69 y.o.   MRN: [id number]  HPI Patient presents today for "medication reaction".  She started taking meloxicam approximately 2 weeks ago for back pain.  Last week on Friday evening she developed painful blisters in her mouth.  Patient has 2 large blood blisters in her mouth.  She states that over the weekend she also developed frequent episodes of epistaxis without reason.  Her husband would look at her and see blood coming from both nostrils which the patient would not have provoked.  She denies blowing her nose or picking or scratching at her nose.  She is also developed a rash on both legs distal to the knee.  The rash consist of numerous 1 to 2 mm purple nonblanchable spots consistent with petechia.  Starting yesterday she developed copious black tarry stools.  She is also seen some bright red blood per rectum.  On examination she has some small bruises on her back below her left scapula.  None of these are provoked.  This is all occurred over the last 48 hours.  Her hemoglobin at her physical 2 weeks ago was 13.  Her rapid hemoglobin today in the office is 11.7.  We discussed going to the emergency room however the patient is very afraid to go to the emergency room right now due to Covid.  Therefore she would like to obtain a stat CBC to evaluate her platelet count along with her white blood cell count and liver function test. Past Medical History:  Diagnosis Date  . 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 GERD (gastroesophageal reflux disease)   . Hyperlipidemia   . Hypertension   . Internal hemorrhoids    Past Surgical History:  Procedure Laterality Date  . CHOLECYSTECTOMY    . TONSILLECTOMY     Current Outpatient Medications on File Prior to Visit  Medication Sig Dispense Refill  . amLODipine (NORVASC) 5 MG tablet TAKE 1 TABLET BY MOUTH EVERY DAY 90 tablet 3  . aspirin 81 MG tablet Take 81 mg  by mouth daily.    . B Complex CAPS Take by mouth.    . Cholecalciferol (VITAMIN D) 50 MCG (2000 UT) tablet Take 2,000 Units by mouth daily.    Marland Kitchen gabapentin (NEURONTIN) 300 MG capsule TAKE 2 CAPSULES (600 MG TOTAL) BY MOUTH 3 (THREE) TIMES DAILY. 540 capsule 2  . hydrochlorothiazide (HYDRODIURIL) 25 MG tablet TAKE 1 TABLET BY MOUTH EVERY DAY 90 tablet 3  . mometasone (ELOCON) 0.1 % cream Apply 1 application topically daily. 45 g 0  . omeprazole (PRILOSEC) 40 MG capsule TAKE ONE CAPSULE BY MOUTH EVERY DAY 90 capsule 3  . zinc gluconate 50 MG tablet Take 50 mg by mouth daily.    . meloxicam (MOBIC) 15 MG tablet Take 1 tablet (15 mg total) by mouth daily. (Patient not taking: Reported on 02/05/2019) 30 tablet 11   No current facility-administered medications on file prior to visit.   Allergies  Allergen Reactions  . Codeine Nausea And Vomiting   Social History   Socioeconomic History  . Marital status: Married    Spouse name: Not on file  . Number of children: Not on file  . Years of education: Not on file  . Highest education level: Not on file  Occupational History  . Not on file  Tobacco Use  . Smoking status: Never Smoker  . Smokeless tobacco: Never Used  Substance and Sexual Activity  . Alcohol use: Yes    Comment: Rare  . Drug use: No  . Sexual activity: Not on file  Other Topics Concern  . Not on file  Social History Narrative   Married. Retired.    Never smoked.    Social Determinants of Health   Financial Resource Strain:   . Difficulty of Paying Living Expenses: Not on file  Food Insecurity:   . Worried About Charity fundraiser in the Last Year: Not on file  . Ran Out of Food in the Last Year: Not on file  Transportation Needs:   . Lack of Transportation (Medical): Not on file  . Lack of Transportation (Non-Medical): Not on file  Physical Activity:   . Days of Exercise per Week: Not on file  . Minutes of Exercise per Session: Not on file  Stress:   .  Feeling of Stress : Not on file  Social Connections:   . Frequency of Communication with Friends and Family: Not on file  . Frequency of Social Gatherings with Friends and Family: Not on file  . Attends Religious Services: Not on file  . Active Member of Clubs or Organizations: Not on file  . Attends Archivist Meetings: Not on file  . Marital Status: Not on file  Intimate Partner Violence:   . Fear of Current or Ex-Partner: Not on file  . Emotionally Abused: Not on file  . Physically Abused: Not on file  . Sexually Abused: Not on file      Review of Systems  All other systems reviewed and are negative.      Objective:   Physical Exam Constitutional:      General: She is not in acute distress.    Appearance: Normal appearance. She is normal weight. She is not ill-appearing or toxic-appearing.  HENT:     Mouth/Throat:     Mouth: Oral lesions present.  Cardiovascular:     Rate and Rhythm: Normal rate and regular rhythm.     Heart sounds: Normal heart sounds. No murmur.  Pulmonary:     Effort: Pulmonary effort is normal. No respiratory distress.     Breath sounds: Normal breath sounds. No stridor. No wheezing, rhonchi or rales.  Chest:     Chest wall: No tenderness.  Abdominal:     General: Abdomen is flat. Bowel sounds are normal. There is no distension.     Palpations: Abdomen is soft.     Tenderness: There is no abdominal tenderness. There is no guarding.  Skin:    Findings: Petechiae and rash present.  Neurological:     Mental Status: She is alert.           Assessment & Plan:  Petechiae - Plan: Hemoglobin, fingerstick, COMPLETE METABOLIC PANEL WITH GFR, CBC with Differential/Platelet  Patient has petechiae on her legs.  She has had melena.  There is been a point to have drop in her hemoglobin.  She also has small hemorrhagic bullae in her mouth.  All this is concerning for thrombocytopenia.  She has had one fever yesterday.  Given the fever, the  thrombocytopenia I suspect that is present, I am concerned about possible TTP versus acute leukemia.  Medication side effect would be less likely.  Recommended going to the emergency room however the patient is very hesitant to do this.  Therefore I will send off a stat CMP and CBC.  If the patient has extreme leukocytosis as well as  thrombocytopenia, I will recommend going to the emergency room for urgent evaluation.  If the patient sees any more bleeding before I have the labs back later this afternoon she is directed to go immediately to the emergency room.  Spent more than 30 minutes today with the patient trying to explain the findings on her physical exam and my concerns.

## 2019-02-05 NOTE — ED Provider Notes (Signed)
[facility] DEPT Provider Note   CSN: [id number] Arrival date & time: 02/05/19  1337     History Chief Complaint  Patient presents with  . Melena    Kelli Terrell is a 69 y.o. female.  Patient from PCPs office with thrombocytopenia and anemia.  States been taking meloxicam for back pain for about the past week.  2 days ago she noticed that she had a petechial rash to her lower extremities and a lesion to her buccal mucosa that was "about the size of a dime".  It is not painful.  She reports a fever of 100.9 2 days ago that has since resolved and only lasted a couple hours.  She noticed some black tarry stools today and yesterday.  She was sent by her PCP after being found to have a platelet count of 2 and a hemoglobin that is dropped 3 g in the past 2 weeks.  White count was normal.  She denies any chest pain or shortness of breath.  No dizziness or lightheadedness.  The history is provided by the patient.       Past Medical History:  Diagnosis Date  . 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 GERD (gastroesophageal reflux disease)   . Hyperlipidemia   . Hypertension   . Internal hemorrhoids     Patient Active Problem List   Diagnosis Date Noted  . ASCUS (atypical squamous cells of undetermined significance) on Pap smear 12/14/2012  . Hyperlipidemia   . Hypertension   . GERD (gastroesophageal reflux disease)     Past Surgical History:  Procedure Laterality Date  . CHOLECYSTECTOMY    . TONSILLECTOMY       OB History   No obstetric history on file.     Family History  Problem Relation Age of Onset  . Cancer Father        kidney  . Stroke Brother   . Cancer Brother        Thyroid Cancer  . Heart disease Brother     Social History   Tobacco Use  . Smoking status: Never Smoker  . Smokeless tobacco: Never Used  Substance Use Topics  . Alcohol use: Yes    Comment: Rare  . Drug use: No    Home  Medications Prior to Admission medications   Medication Sig Start Date End Date Taking? Authorizing Provider  amLODipine (NORVASC) 5 MG tablet TAKE 1 TABLET BY MOUTH EVERY DAY 01/01/19   Susy Frizzle, MD  aspirin 81 MG tablet Take 81 mg by mouth daily.    [provider]  B Complex CAPS Take by mouth.    [provider]  Cholecalciferol (VITAMIN D) 50 MCG (2000 UT) tablet Take 2,000 Units by mouth daily.    [provider]  gabapentin (NEURONTIN) 300 MG capsule TAKE 2 CAPSULES (600 MG TOTAL) BY MOUTH 3 (THREE) TIMES DAILY. 09/21/18   Susy Frizzle, MD  hydrochlorothiazide (HYDRODIURIL) 25 MG tablet TAKE 1 TABLET BY MOUTH EVERY DAY 03/24/18   Susy Frizzle, MD  meloxicam (MOBIC) 15 MG tablet Take 1 tablet (15 mg total) by mouth daily. Patient not taking: Reported on 02/05/2019 01/25/19   Susy Frizzle, MD  mometasone (ELOCON) 0.1 % cream Apply 1 application topically daily. 01/25/19   Susy Frizzle, MD  omeprazole (PRILOSEC) 40 MG capsule TAKE ONE CAPSULE BY MOUTH EVERY DAY 06/26/18   Susy Frizzle, MD  zinc gluconate 50 MG  tablet Take 50 mg by mouth daily.    [provider]    Allergies    Codeine  Review of Systems   Review of Systems  Physical Exam Updated Vital Signs BP (!) 141/83 (BP Location: Left Arm)   Pulse (!) 109   Temp 97.9 F (36.6 C) (Oral)   Resp 17   SpO2 96%   Physical Exam Vitals and nursing note reviewed.  Constitutional:      General: She is not in acute distress.    Appearance: She is well-developed. She is obese.  HENT:     Head: Normocephalic and atraumatic.     Mouth/Throat:     Pharynx: No oropharyngeal exudate.     Comments: Skin lesions of left buccal mucosa with overlying ecchymosis.  There is no active bleeding Eyes:     Conjunctiva/sclera: Conjunctivae normal.     Pupils: Pupils are equal, round, and reactive to light.  Neck:     Comments: No meningismus. Cardiovascular:     Rate and  Rhythm: Normal rate and regular rhythm.     Heart sounds: Normal heart sounds. No murmur.  Pulmonary:     Effort: Pulmonary effort is normal. No respiratory distress.     Breath sounds: Normal breath sounds.  Abdominal:     Palpations: Abdomen is soft.     Tenderness: There is no abdominal tenderness. There is no guarding or rebound.  Musculoskeletal:        General: No tenderness. Normal range of motion.     Cervical back: Normal range of motion and neck supple.  Skin:    General: Skin is warm.     Findings: Rash present.     Comments: Petechial rash of lower extremities  Neurological:     Mental Status: She is alert and oriented to person, place, and time.     Cranial Nerves: No cranial nerve deficit.     Motor: No abnormal muscle tone.     Coordination: Coordination normal.     Comments:  5/5 strength throughout. CN 2-12 intact.Equal grip strength.   Psychiatric:        Behavior: Behavior normal.     ED Results / Procedures / Treatments   Labs (all labs ordered are listed, but only abnormal results are displayed) Labs Reviewed  RESPIRATORY PANEL BY RT PCR (FLU A&B, COVID) - Abnormal; Notable for the following components:      Result Value   SARS Coronavirus 2 by RT PCR POSITIVE (*)    All other components within normal limits  COMPREHENSIVE METABOLIC PANEL - Abnormal; Notable for the following components:   Glucose, Bld 147 (*)    BUN 28 (*)    Creatinine, Ser 1.09 (*)    Calcium 10.5 (*)    GFR calc non Af Amer 51 (*)    GFR calc Af Amer 60 (*)    All other components within normal limits  CBC - Abnormal; Notable for the following components:   WBC 14.6 (*)    RBC 3.64 (*)    Hemoglobin 10.5 (*)    HCT 31.8 (*)    Platelets 1 (*)    All other components within normal limits  RETICULOCYTES - Abnormal; Notable for the following components:   RBC. 3.65 (*)    Immature Retic Fract 26.5 (*)    All other components within normal limits  PROTIME-INR  LACTATE  DEHYDROGENASE  HAPTOGLOBIN  ADAMTS13 ACTIVITY  POC OCCULT BLOOD, ED  TYPE AND SCREEN  ABO/RH  TYPE AND SCREEN  PREPARE PLATELET PHERESIS    EKG None  Radiology DG Chest Port 1 View  Result Date: 02/05/2019 CLINICAL DATA:  Black, tarry stools. EXAM: PORTABLE CHEST 1 VIEW COMPARISON:  October 04, 2011 FINDINGS: The heart size and mediastinal contours are within normal limits. Both lungs are clear. There is moderate severity calcification of the aortic arch. The visualized skeletal structures are unremarkable. IMPRESSION: No active disease. Electronically Signed   By: Virgina Norfolk M.D.   On: 02/05/2019 17:02    Procedures .Critical Care Performed by: Ezequiel Essex, MD Authorized by: Ezequiel Essex, MD   Critical care provider statement:    Critical care time (minutes):  45   Critical care was necessary to treat or prevent imminent or life-threatening deterioration of the following conditions:  Circulatory failure   Critical care was time spent personally by me on the following activities:  Discussions with consultants, evaluation of patient's response to treatment, examination of patient, ordering and performing treatments and interventions, ordering and review of laboratory studies, ordering and review of radiographic studies, pulse oximetry, re-evaluation of patient's condition, obtaining history from patient or surrogate and review of old charts   (including critical care time)  Medications Ordered in ED Medications  sodium chloride 0.9 % bolus 1,000 mL (has no administration in time range)    And  0.9 %  sodium chloride infusion (has no administration in time range)  pantoprazole (PROTONIX) injection 40 mg (has no administration in time range)    ED Course  I have reviewed the triage vital signs and the nursing notes.  Pertinent labs & imaging results that were available during my care of the patient were reviewed by me and considered in my medical decision  making (see chart for details).    MDM Rules/Calculators/A&P                     Patient sent from PCP with anemia and thrombocytopenia as well as petechial rash and black stools.  Did have back pain for the past week and has been taken meloxicam.  Vitals are stable, she is in no distress. Has anemia as well as thrombocytopenia. WBC normal.  LFTs normal.   She did have a fever 2 days ago but none now.  Concern for ITP or TTP. Discussed with hematology Dr. Burr Medico who will evaluate patient.  Does not recommend any blood products or steroids at this point.  Dr. Burr Medico has seen patient and reviewed her peripheral smear. She suspects ITP rather than TTP. Patient also found to be COVID positive. Dr. Burr Medico feels her thrombocytopenia may be related to her covid infection. She is ordering IVIG as well as platelets. Does not recommend steroids at this point. Recommends medical admission.  Dr. Zenia Resides to admit to hospitalist at shift change.  Kelli Terrell was evaluated in Emergency Department on 02/05/2019 for the symptoms described in the history of present illness. She was evaluated in the context of the global COVID-19 pandemic, which necessitated consideration that the patient might be at risk for infection with the SARS-CoV-2 virus that causes COVID-19. Institutional protocols and algorithms that pertain to the evaluation of patients at risk for COVID-19 are in a state of rapid change based on information released by regulatory bodies including the CDC and federal and state organizations. These policies and algorithms were followed during the patient's care in the ED.   Final Clinical Impression(s) / ED Diagnoses Final diagnoses:  Acute ITP ([place])  Rx / DC Orders ED Discharge Orders    None       Lashawna Poche, Annie Main, MD 02/05/19 1712

## 2019-02-05 NOTE — ED Notes (Signed)
X-ray at bedside

## 2019-02-05 NOTE — ED Notes (Signed)
Dr. Wyvonnia Dusky is at bedside.

## 2019-02-05 NOTE — ED Triage Notes (Signed)
Pt arrives today after her PCP instructed her to. Pt state that she had been taking meloxicam for 8 days, the last on Friday, 02/02/19. Pt states that she noticed 2 dime size raised areas on the left side of her mouth that day. Pt states that she has also noticed petechia on her legs, and is very weak. Pt states that she noticed black tarry stool last night at 2300.  Pt states that her Dr told her to come in immediately as her hgb has dropped down from 13.3 to 10.3, and her platelets have dropped from >300 to 2.

## 2019-02-05 NOTE — Plan of Care (Signed)
  Problem: Coping: Goal: Psychosocial and spiritual needs will be supported Outcome: Progressing   Problem: Respiratory: Goal: Will maintain a patent airway Outcome: Progressing   

## 2019-02-05 NOTE — Consult Note (Addendum)
[facility]  [phone] Fax:([phone]) [place]  Referring MD:  Dr. Lacretia Leigh  Reason for Referral: Severe thrombocytopenia and anemia  HPI: Kelli Terrell is a 69 year old female with a past medical history significant for GERD, hypertension, hyperlipidemia, and back pain.  Patient presented to the hospital earlier today after abnormal labs performed by her primary care provider.  Labs performed earlier today at her primary care provider showed a hemoglobin of 10.5 and platelet count of 2000.  She had a CBC performed on 01/22/2019 as part of her annual physical which was completely normal.  The patient reported that she noticed a rash on her legs and arms.  She also had an ulcer in her mouth which was bleeding.  She also reported that she had black, tarry stools with bright red blood yesterday.  However today, she states that she still had black and tarry stools without obvious blood.  She has not had any nausea or vomiting.  Reports normal colonoscopy in about 2017/2018.  She reported that she had a fever up to 100.9 two days ago with chills.  This has now resolved.  Denies recent sick contacts.  She has not had any headaches, dizziness, confusion, vision changes.  She denies chest discomfort, shortness of breath, cough.  Reports that she has been taking meloxicam for approximately 8 days.  No other new medications or herbal supplements.  States that she has taken Aleve in the past without any difficulty with bleeding.  Of note, at the end of our visit, we were informed by the ER staff that she tested positive for COVID-19.  Hematology was asked see the patient to make recommendations regarding her thrombocytopenia and anemia.  Past Medical History:  Diagnosis Date  . 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 GERD (gastroesophageal reflux disease)   . Hyperlipidemia   . Hypertension   . Internal hemorrhoids    :    Past Surgical History:  Procedure Laterality Date  . CHOLECYSTECTOMY    . TONSILLECTOMY    :   CURRENT MEDS: Current Facility-Administered Medications  Medication Dose Route Frequency Provider Last Rate Last Admin  . 0.9 %  sodium chloride infusion   Intravenous Continuous Rancour, Annie Main, MD 125 mL/hr at 02/05/19 1501 New Bag/Given (Non-Interop) at 02/05/19 1501   Current Outpatient Medications  Medication Sig Dispense Refill  . amLODipine (NORVASC) 5 MG tablet TAKE 1 TABLET BY MOUTH EVERY DAY 90 tablet 3  . aspirin 81 MG tablet Take 81 mg by mouth daily.    . B Complex CAPS Take by mouth.    . Cholecalciferol (VITAMIN D) 50 MCG (2000 UT) tablet Take 2,000 Units by mouth daily.    Marland Kitchen gabapentin (NEURONTIN) 300 MG capsule TAKE 2 CAPSULES (600 MG TOTAL) BY MOUTH 3 (THREE) TIMES DAILY. 540 capsule 2  . hydrochlorothiazide (HYDRODIURIL) 25 MG tablet TAKE 1 TABLET BY MOUTH EVERY DAY 90 tablet 3  . meloxicam (MOBIC) 15 MG tablet Take 1 tablet (15 mg total) by mouth daily. (Patient not taking: Reported on 02/05/2019) 30 tablet 11  . mometasone (ELOCON) 0.1 % cream Apply 1 application topically daily. 45 g 0  . omeprazole (PRILOSEC) 40 MG capsule TAKE ONE CAPSULE BY MOUTH EVERY DAY 90 capsule 3  . zinc gluconate 50 MG tablet Take 50 mg by mouth daily.        Allergies  Allergen Reactions  . Codeine Nausea And Vomiting  :  Family History  Problem Relation Age of Onset  . Cancer Father        kidney  . Stroke Brother   . Cancer Brother        Thyroid Cancer  . Heart disease Brother   :  Social History   Socioeconomic History  . Marital status: Married    Spouse name: Not on file  . Number of children: Not on file  . Years of education: Not on file  . Highest education level: Not on file  Occupational History  . Not on file  Tobacco Use  . Smoking status: Never Smoker  . Smokeless tobacco: Never Used  Substance and Sexual Activity  . Alcohol use: Yes     Comment: Rare  . Drug use: No  . Sexual activity: Not on file  Other Topics Concern  . Not on file  Social History Narrative   Married. Retired.    Never smoked.    Social Determinants of Health   Financial Resource Strain:   . Difficulty of Paying Living Expenses: Not on file  Food Insecurity:   . Worried About Charity fundraiser in the Last Year: Not on file  . Ran Out of Food in the Last Year: Not on file  Transportation Needs:   . Lack of Transportation (Medical): Not on file  . Lack of Transportation (Non-Medical): Not on file  Physical Activity:   . Days of Exercise per Week: Not on file  . Minutes of Exercise per Session: Not on file  Stress:   . Feeling of Stress : Not on file  Social Connections:   . Frequency of Communication with Friends and Family: Not on file  . Frequency of Social Gatherings with Friends and Family: Not on file  . Attends Religious Services: Not on file  . Active Member of Clubs or Organizations: Not on file  . Attends Archivist Meetings: Not on file  . Marital Status: Not on file  Intimate Partner Violence:   . Fear of Current or Ex-Partner: Not on file  . Emotionally Abused: Not on file  . Physically Abused: Not on file  . Sexually Abused: Not on file  :  REVIEW OF SYSTEMS: A comprehensive review of systems was negative except as noted in HPI.  Exam: Patient Vitals for the past 24 hrs:  BP Temp Temp src Pulse Resp SpO2  02/05/19 1430 99/79 -- -- 95 18 99 %  02/05/19 1403 (!) 141/83 97.9 F (36.6 C) Oral (!) 109 17 96 %    General:  well-nourished in no acute distress.   Eyes:  no scleral icterus.   ENT: She has a lesion in her left oropharynx, not actively bleeding.  No thrush. Lymphatics:  Negative cervical, supraclavicular or axillary adenopathy.  Respiratory: lungs were clear bilaterally without wheezing or crackles.  Cardiovascular:  Regular rate and rhythm, S1/S2, without murmur, rub or gallop.  There was no pedal  edema.   GI:  abdomen was soft, flat, nontender, nondistended, without organomegaly.   Skin: Petechiae noted to her bilateral lower extremities and no petechiae noted on her upper extremities.   Neuro exam was nonfocal. Patient was alert and oriented.  Attention was good.   Language was appropriate.  Mood was normal without depression.  Speech was not pressured.  Thought content was not tangential.    LABS:  Lab Results  Component Value Date   WBC 10.3 02/05/2019   HGB 10.5 (L) 02/05/2019  HCT 31.6 (L) 02/05/2019   PLT 2 (LL) 02/05/2019   GLUCOSE 147 (H) 02/05/2019   CHOL 204 (H) 01/22/2019   TRIG 140 01/22/2019   HDL 48 (L) 01/22/2019   LDLCALC 131 (H) 01/22/2019   ALT 25 02/05/2019   AST 29 02/05/2019   NA 139 02/05/2019   K 3.5 02/05/2019   CL 104 02/05/2019   CREATININE 1.09 (H) 02/05/2019   BUN 28 (H) 02/05/2019   CO2 25 02/05/2019   INR 1.1 02/05/2019   HGBA1C 6.5 (H) 01/22/2019    No results found.   ASSESSMENT AND PLAN:  1.  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  Anemia secondary to GI blood loss 3.  COVID-19 infection 4.  Hypertension 5.  GERD  -Peripheral blood smear has been reviewed.  No schistocytes noted.  There is no evidence of TTP.  Initially thought this was ITP until we were informed that she is COVID-19 positive.  Thrombocytopenia likely due to ITP versus her COVID-19 infection as causes.  Recommend platelet transfusion to keep platelet count above 10,000.  Will start her on IVIG 1 g/kg x 2 doses.  We will hold off on dexamethasone given that she is not hypoxic as this could worsen her COVID-19 infection. -Recommend close monitoring of her platelet count. -Hemoglobin is 10.5.  Recommend close monitoring.  Transfuse PRBCs for hemoglobin less than 7 or active bleeding.  Thank you for this referral.  Mikey Bussing, DNP, AGPCNP-BC, AOCNP  Addendum  I have seen the patient, examined her. I agree with the assessment and  and plan and have edited the notes.   I have reviewed her lab results and the peripheral blood smear myself, her ret count is in normal range, LDH and tbil normal, no significant schistocytes on peripheral smear (I only saw 2 schistocytes more than 10 HP), no lab evidence of hemolysis, TTP is ruled out.  Her severe thrombocytopenia is likely Covid related, although the severity is out of the typical mild to moderate thrombocytopenia from COVID.  ITP is also a possibility, may be related to the infection.  I will give IVIG today and tomorrow, will hold on steroids due to her relatively mild symptoms from COVID, and my concern still with may suppress her immune system to reduce her immunity to fight Covid in this mild COVID case. If she develops hypoxia and need for steroids for Covid, then certainly steroid could be used and it may help her thrombocytopenia.   I recommend plt transfusion to keep her plt>10k due to the concern of severe bleeding from infection related thrombocytopenia.   Her GI bleeding could be related to meloxicam and severe thrombocytopenia. Please monitor H/H.   I called her husband and updated him.   We will f/u her labs, please call us if needed.   Truitt Merle  02/05/2019

## 2019-02-05 NOTE — H&P (Signed)
[facility] History and Physical  Kelli Terrell [id number] DOB: Dec 11, 1950 DOA: 02/05/2019 Referring physician: ED PCP: Susy Frizzle, MD  Chief Complaint: Fever, weakness, petechiae Came from: Home, PCPs office ------------------------------------------------------------------------------------------------------ Assessment/Plan: Active Problems:   Thrombocytopenia (HCC)  Severe thrombocytopenia -Platelet count very low at 1.  Unclear etiology.  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 Burr Medico.  Apparently, peripheral blood smear did not show schistocytosis to suggest TTP.   -Recommended platelet transfusion to keep platelet level more than 10,000.  1 unit ordered.  Repeat platelet in a.m. -Patient has also been started on IVIG 1 g/kg for 2 doses. -Dexamethasone remains on hold. -Continue to monitor platelet count.  Acute anemia -Hemoglobin dropped from 13 from 2 weeks ago to 10.5.  Patient reports one episode of melena last night.  Continue to monitor hemoglobin.  Transfuse if less than 7.  COVID-19 infection -PCR positive.  No Covid related symptoms.  Chest x-ray negative for any infiltrates. -Not started on any dexamethasone or remdesivir. -Monitor inflammatory markers.  Hypertension -Home meds include amlodipine, HCTZ -We will continue amlodipine.  Keep HCTZ on hold, concern of drug reaction.  Will check with hematology  Hyperlipidemia - not on statin.  Patient seems to be on aspirin 81 mg daily.  We will hold it for now.  GERD -PPI  Mobility: Encourage ambulation Diet: Cardiac diet DVT prophylaxis:  SCDs Code Status:  Full code Family Communication:  Not at bedside.  Patient updating by herself Disposition Plan:  Anticipate more than 2 midnight stay  ----------------------------------------------------------------------------------------------------- History of Present Illness: Kelli Terrell is to monitor platelet count. 69 y.o. female  with PMH significant for HTN, HLD, GERD, AVM of colon, internal hemorrhoids. Patient was sent to the ED by her primary care provider today for severe thrombocytopenia.   Patient states that she had been taking meloxicam for the last 8 days, the last on Friday, 02/02/19.  She noticed 2 dime size raised areas on the left side of her mouth that day.  She also noticed petechia on her legs, and started having progressive weakness.  Patient noticed black tarry stool last night. Patient went to her PCPs office today.  She was noted to have hemorrhagic bullae in her mouth.  CBC and CMP was done.  Compared to her normal blood work from 2 weeks ago, her hgb was noted to have dropped down from 13.3 to 10.3, and her platelets have dropped from >300 to 2.  In the ED, patient was afebrile.  Breathing comfortably in room air.  Blood pressure elevated as high as 159/85. Blood work from this afternoon compared to this morning showed WBC count increasing from 10.2-14.6, globin stable at 10.5 and platelet count further down from 2000 to 1000. COVID-19 test PCR positive. Chest x-ray did not show any acute infiltrate.  Review of Systems:  All systems were reviewed and were negative unless otherwise mentioned in the HPI   Past medical history: Past Medical History:  Diagnosis Date  . 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 GERD (gastroesophageal reflux disease)   . Hyperlipidemia   . Hypertension   . Internal hemorrhoids     Past surgical history: Past Surgical History:  Procedure Laterality Date  . CHOLECYSTECTOMY    . TONSILLECTOMY      Social History:  reports that she has never smoked. She has never used smokeless tobacco. She reports current alcohol use. She reports that she does not use drugs.  Allergies:  Allergies  Allergen Reactions  . Codeine Nausea And Vomiting    Family history:  Family History  Problem Relation Age of Onset  . Cancer Father        kidney  .  Stroke Brother   . Cancer Brother        Thyroid Cancer  . Heart disease Brother      Home Meds: Prior to Admission medications   Medication Sig Start Date End Date Taking? Authorizing Provider  amLODipine (NORVASC) 5 MG tablet TAKE 1 TABLET BY MOUTH EVERY DAY Patient taking differently: Take 5 mg by mouth daily.  01/01/19  Yes Susy Frizzle, MD  aspirin 81 MG tablet Take 81 mg by mouth daily.   Yes [provider]  B Complex CAPS Take by mouth.   Yes [provider]  Biotin 10 MG TABS Take 10 mg by mouth daily.   Yes [provider]  Cholecalciferol (VITAMIN D) 50 MCG (2000 UT) tablet Take 2,000 Units by mouth daily.   Yes [provider]  gabapentin (NEURONTIN) 300 MG capsule TAKE 2 CAPSULES (600 MG TOTAL) BY MOUTH 3 (THREE) TIMES DAILY. Patient taking differently: Take 600 mg by mouth 2 (two) times daily.  09/21/18  Yes Susy Frizzle, MD  hydrochlorothiazide (HYDRODIURIL) 25 MG tablet TAKE 1 TABLET BY MOUTH EVERY DAY Patient taking differently: Take 25 mg by mouth daily.  03/24/18  Yes Susy Frizzle, MD  mometasone (ELOCON) 0.1 % cream Apply 1 application topically daily. 01/25/19  Yes Susy Frizzle, MD  omeprazole (PRILOSEC) 40 MG capsule TAKE ONE CAPSULE BY MOUTH EVERY DAY Patient taking differently: Take 40 mg by mouth every other day.  06/26/18  Yes Susy Frizzle, MD  zinc gluconate 50 MG tablet Take 50 mg by mouth daily.   Yes [provider]  meloxicam (MOBIC) 15 MG tablet Take 1 tablet (15 mg total) by mouth daily. Patient not taking: Reported on 02/05/2019 01/25/19   Susy Frizzle, MD    Physical Exam: Vitals:   02/05/19 1630 02/05/19 1700 02/05/19 1712 02/05/19 1730  BP: (!) 141/82 (!) 159/85 (!) 159/85 134/72  Pulse: 88 85 99 80  Resp:  16 (!) 25 15  Temp:      TempSrc:      SpO2: 99% 98% 97% 96%   Wt Readings from Last 3 Encounters:  01/25/19 97.5 kg  01/20/18 100.2 kg  09/14/17 101.6 kg   There is  no height or weight on file to calculate BMI.  General exam: Appears calm and comfortable.  Skin: No rashes, lesions or ulcers. HEENT: A lesion in the left side of the oropharynx, no active bleeding. Lungs: Clear to auscultation bilaterally CVS: Regular rate and rhythm, no murmur GI/Abd soft, nontender, nondistended, bowel sound present CNS: Alert, awake, oriented x3 Psychiatry: Mood appropriate Extremities: No pedal edema, no calf tenderness     Consult Orders  (From admission, onward)         Start     Ordered   02/05/19 1622  Consult to hospitalist  Once    Provider:  (Not yet assigned)  Question Answer Comment  Place call to: [facility]   Reason for Consult Admit      02/05/19 1621          Labs on Admission:   CBC: Recent Labs  Lab 02/05/19 0952 02/05/19 1424  WBC 10.3 14.6*  NEUTROABS 5,408  --   HGB 10.5* 10.5*  HCT 31.6*  31.8*  MCV 85.6 87.4  PLT 2* 1*    Basic Metabolic Panel: Recent Labs  Lab 02/05/19 0952 02/05/19 1424  NA 140 139  K 3.8 3.5  CL 104 104  CO2 25 25  GLUCOSE 158* 147*  BUN 24 28*  CREATININE 0.97* 1.09*  CALCIUM 10.1 10.5*    Liver Function Tests: Recent Labs  Lab 02/05/19 0952 02/05/19 1424  AST 19 29  ALT 18 25  ALKPHOS  --  69  BILITOT 0.4 0.5  PROT 6.5 7.4  ALBUMIN  --  3.7   No results for input(s): LIPASE, AMYLASE in the last 168 hours. No results for input(s): AMMONIA in the last 168 hours.  Cardiac Enzymes: No results for input(s): CKTOTAL, CKMB, CKMBINDEX, TROPONINI in the last 168 hours.  BNP (last 3 results) No results for input(s): BNP in the last 8760 hours.  ProBNP (last 3 results) No results for input(s): PROBNP in the last 8760 hours.  CBG: No results for input(s): GLUCAP in the last 168 hours.  Lipase  No results found for: LIPASE   Urinalysis    Component Value Date/Time   COLORURINE YELLOW 08/16/2014 [place] 08/16/2014 0924   LABSPEC 1.015 08/16/2014  0924   PHURINE 6.0 08/16/2014 0924   GLUCOSEU NEGATIVE 08/16/2014 0924   HGBUR TRACE (A) 08/16/2014 0924   BILIRUBINUR NEGATIVE 08/16/2014 0924   KETONESUR NEGATIVE 08/16/2014 0924   PROTEINUR NEGATIVE 08/16/2014 0924   NITRITE NEGATIVE 08/16/2014 0924   LEUKOCYTESUR 1+ (A) 08/16/2014 0924     Drugs of Abuse  No results found for: LABOPIA, COCAINSCRNUR, LABBENZ, AMPHETMU, THCU, LABBARB    Radiological Exams on Admission: DG Chest Port 1 View  Result Date: 02/05/2019 CLINICAL DATA:  Black, tarry stools. EXAM: PORTABLE CHEST 1 VIEW COMPARISON:  October 04, 2011 FINDINGS: The heart size and mediastinal contours are within normal limits. Both lungs are clear. There is moderate severity calcification of the aortic arch. The visualized skeletal structures are unremarkable. IMPRESSION: No active disease. Electronically Signed   By: Virgina Norfolk M.D.   On: 02/05/2019 17:02   ----------------------------------------------------------------------------------------------------------------------------------------------------------- Severity of Illness: The appropriate patient status for this patient is INPATIENT. Inpatient status is judged to be reasonable and necessary in order to provide the required intensity of service to ensure the patient's safety. The patient's presenting symptoms, physical exam findings, and initial radiographic and laboratory data in the context of their chronic comorbidities is felt to place them at high risk for further clinical deterioration. Furthermore, it is not anticipated that the patient will be medically stable for discharge from the hospital within 2 midnights of admission. The following factors support the patient status of inpatient.   " The patient's presenting symptoms include petechiae in legs, hemorrhagic bullae in the mouth. " The worrisome physical exam findings include hemorrhagic bullae in mouth. " The initial radiographic and laboratory data are  worrisome because of severe thrombocytopenia. " The chronic co-morbidities include advanced age, AVM in colon..   * I certify that at the point of admission it is my clinical judgment that the patient will require inpatient hospital care spanning beyond 2 midnights from the point of admission due to high intensity of service, high risk for further deterioration and high frequency of surveillance required.*   Signed, Terrilee Croak, MD [facility] 02/05/2019

## 2019-02-06 DIAGNOSIS — K921 Melena: Secondary | ICD-10-CM

## 2019-02-06 LAB — CBC WITH DIFFERENTIAL/PLATELET
Abs Immature Granulocytes: 0.11 10*3/uL — ABNORMAL HIGH (ref 0.00–0.07)
Basophils Absolute: 0 10*3/uL (ref 0.0–0.1)
Basophils Relative: 0 %
Eosinophils Absolute: 0.2 10*3/uL (ref 0.0–0.5)
Eosinophils Relative: 2 %
HCT: 23.2 % — ABNORMAL LOW (ref 36.0–46.0)
Hemoglobin: 7.5 g/dL — ABNORMAL LOW (ref 12.0–15.0)
Immature Granulocytes: 1 %
Lymphocytes Relative: 23 %
Lymphs Abs: 2.1 10*3/uL (ref 0.7–4.0)
MCH: 29.1 pg (ref 26.0–34.0)
MCHC: 32.3 g/dL (ref 30.0–36.0)
MCV: 89.9 fL (ref 80.0–100.0)
Monocytes Absolute: 0.5 10*3/uL (ref 0.1–1.0)
Monocytes Relative: 5 %
Neutro Abs: 6 10*3/uL (ref 1.7–7.7)
Neutrophils Relative %: 69 %
Platelets: 67 10*3/uL — ABNORMAL LOW (ref 150–400)
RBC: 2.58 MIL/uL — ABNORMAL LOW (ref 3.87–5.11)
RDW: 13.6 % (ref 11.5–15.5)
WBC: 8.9 10*3/uL (ref 4.0–10.5)
nRBC: 0 % (ref 0.0–0.2)

## 2019-02-06 LAB — COMPREHENSIVE METABOLIC PANEL
ALT: 18 U/L (ref 0–44)
AST: 19 U/L (ref 15–41)
Albumin: 2.9 g/dL — ABNORMAL LOW (ref 3.5–5.0)
Alkaline Phosphatase: 56 U/L (ref 38–126)
Anion gap: 8 (ref 5–15)
BUN: 21 mg/dL (ref 8–23)
CO2: 25 mmol/L (ref 22–32)
Calcium: 9.5 mg/dL (ref 8.9–10.3)
Chloride: 104 mmol/L (ref 98–111)
Creatinine, Ser: 0.9 mg/dL (ref 0.44–1.00)
GFR calc Af Amer: >60 mL/min (ref >60)
GFR calc non Af Amer: >60 mL/min (ref >60)
Glucose, Bld: 140 mg/dL — ABNORMAL HIGH (ref 70–99)
Potassium: 3.7 mmol/L (ref 3.5–5.1)
Sodium: 137 mmol/L (ref 135–145)
Total Bilirubin: 0.4 mg/dL (ref 0.3–1.2)
Total Protein: 7.9 g/dL (ref 6.5–8.1)

## 2019-02-06 LAB — IRON AND TIBC
Iron: 61 ug/dL (ref 28–170)
Saturation Ratios: 21 % (ref 10.4–31.8)
TIBC: 292 ug/dL (ref 250–450)
UIBC: 231 ug/dL

## 2019-02-06 LAB — HAPTOGLOBIN: Haptoglobin: 170 mg/dL (ref 37–355)

## 2019-02-06 LAB — RETICULOCYTES
Immature Retic Fract: 26.7 % — ABNORMAL HIGH (ref 2.3–15.9)
RBC.: 2.63 MIL/uL — ABNORMAL LOW (ref 3.87–5.11)
Retic Count, Absolute: 61 10*3/uL (ref 19.0–186.0)
Retic Ct Pct: 2.3 % (ref 0.4–3.1)

## 2019-02-06 LAB — FERRITIN: Ferritin: 47 ng/mL (ref 11–307)

## 2019-02-06 LAB — HIV ANTIBODY (ROUTINE TESTING W REFLEX): HIV Screen 4th Generation wRfx: NONREACTIVE

## 2019-02-06 LAB — PREPARE RBC (CROSSMATCH)

## 2019-02-06 LAB — D-DIMER, QUANTITATIVE: D-Dimer, Quant: 0.5 ug/mL-FEU (ref 0.00–0.50)

## 2019-02-06 LAB — C-REACTIVE PROTEIN: CRP: 1.1 mg/dL — ABNORMAL HIGH (ref <1.0)

## 2019-02-06 MED ORDER — SODIUM CHLORIDE 0.9% IV SOLUTION: Freq: Once | INTRAVENOUS | Status: AC

## 2019-02-06 MED ORDER — SODIUM CHLORIDE 0.9% IV SOLUTION: Freq: Once | INTRAVENOUS | Status: DC

## 2019-02-06 NOTE — Progress Notes (Signed)
Pt up to [facility] with small loose frank bloody stool.

## 2019-02-06 NOTE — Progress Notes (Signed)
HEMATOLOGY-ONCOLOGY PROGRESS NOTE  SUBJECTIVE: Due to the patient's COVID-19 positive status, this visit was performed by telephone.  I was able to connect with Ms. Kelli Terrell.  I used to identifiers.  The patient was located at [facility] long room 1534 and I was at the cancer center in my office.  Patient reports that she is feeling well this morning. She has not noticed any recurrent GI bleeding.  Tolerated IVIG and platelets well overall.  She is not having any respiratory symptoms this morning.  Reports mouth ulcer feels better.  Petechiae still present.  REVIEW OF SYSTEMS:   Noncontributory except as noted in the HPI.  I have reviewed the past medical history, past surgical history, social history and family history with the patient and they are unchanged from previous note.   PHYSICAL EXAMINATION:  Vitals:   02/06/19 0414 02/06/19 0535  BP: 126/73 125/75  Pulse: 83 83  Resp: 16 16  Temp: 99.4 F (37.4 C) 98.9 F (37.2 C)  SpO2: 100% 98%   Filed Weights   02/05/19 2300  Weight: 214 lb 15.2 oz (97.5 kg)    Intake/Output from previous day: 01/25 0701 - 01/26 0700 In: 2491.2 [P.O.:240; I.V.:1251.2; IV Piggyback:1000] Out: 1200 [Urine:1200]  Physical exam per primary team.  LABORATORY DATA:  I have reviewed the data as listed CMP Latest Ref Rng & Units 02/06/2019 02/05/2019 02/05/2019  Glucose 70 - 99 mg/dL 140(H) 147(H) 158(H)  BUN 8 - 23 mg/dL 21 28(H) 24  Creatinine 0.44 - 1.00 mg/dL 0.90 1.09(H) 0.97(H)  Sodium 135 - 145 mmol/L 137 139 140  Potassium 3.5 - 5.1 mmol/L 3.7 3.5 3.8  Chloride 98 - 111 mmol/L 104 104 104  CO2 22 - 32 mmol/L 25 25 25   Calcium 8.9 - 10.3 mg/dL 9.5 10.5(H) 10.1  Total Protein 6.5 - 8.1 g/dL 7.9 7.4 6.5  Total Bilirubin 0.3 - 1.2 mg/dL 0.4 0.5 0.4  Alkaline Phos 38 - 126 U/L 56 69 -  AST 15 - 41 U/L 19 29 19   ALT 0 - 44 U/L 18 25 18     Lab Results  Component Value Date   WBC 8.9 02/06/2019   HGB 7.5 (L) 02/06/2019   HCT 23.2 (L)  02/06/2019   MCV 89.9 02/06/2019   PLT 67 (L) 02/06/2019   NEUTROABS 6.0 02/06/2019    DG Chest Port 1 View  Result Date: 02/05/2019 CLINICAL DATA:  Black, tarry stools. EXAM: PORTABLE CHEST 1 VIEW COMPARISON:  October 04, 2011 FINDINGS: The heart size and mediastinal contours are within normal limits. Both lungs are clear. There is moderate severity calcification of the aortic arch. The visualized skeletal structures are unremarkable. IMPRESSION: No active disease. Electronically Signed   By: Virgina Norfolk M.D.   On: 02/05/2019 17:02    ASSESSMENT AND PLAN: 1.  Severe thrombocytopenia, due to Covid infection versus ITP 2.  Anemia secondary to GI blood loss versus Covid infection 3.  COVID-19 infection 4.  Hypertension 5.  GERD  -Labs from today have been reviewed.  Platelet count has improved to 67,000 following 1 unit of platelets and her first dose of IVIG.  Recommend for her to proceed with her second dose of IVIG today as scheduled.  Transfusion not indicated today.  Recommend platelet transfusion if her platelet count is less than 10,000 or active bleeding. -Hemoglobin continues to drift down.  She had some GI bleeding prior to admission which she reports has now resolved.  Recommend 1 unit PRBCs today.  May need GI consult if she continues to have any GI bleeding.  -Recommend daily CBC.    LOS: 1 day   Mikey Bussing, DNP, AGPCNP-BC, AOCNP 02/06/19

## 2019-02-06 NOTE — Progress Notes (Signed)
PROGRESS NOTE    Kelli Terrell   [id number]  DOB: [birth year]  DOA: 02/05/2019 PCP: Susy Frizzle, MD   Brief Narrative:  Kelli Terrell is to monitor platelet count. 69 y.o. female with PMH significant for HTN, HLD, GERD, AVM of colon, internal hemorrhoids. Patient was sent to the ED by her primary care provider today for severe thrombocytopenia.   Patient states that she had been taking meloxicam for the last 8 days, the last on Friday, 02/02/19.  She noticed 2 dime size raised areas on the left side of her mouth that day.  She also noticed petechia on her legs, and started having progressive weakness.  Patient noticed black tarry stool last night.  Patient went to her PCPs office.  She was noted to have hemorrhagic bullae in her mouth.  CBC and CMP was done.  Compared to her normal blood work from 2 weeks ago, her hgb was noted to have dropped down from 13.3 to 10.3, and her platelets have dropped from >300 to 2.  In the ED, patient was afebrile.  Breathing comfortably in room air.  Blood pressure elevated as high as 159/85. Blood work from this afternoon compared to this morning showed WBC count increasing from 10.2-14.6, globin stable at 10.5 and platelet count further down from 2000 to 1000. COVID-19 test PCR positive. Chest x-ray did not show any acute infiltrate.  Subjective: She feels "fine".     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- With petechial rash on legs - she presented with platelets of 2- last platelets were 363 on 1/11 - started on IVIG and given 1 U of pheresed platelets- count is up to 67 today - she is not bleeding at the current time - appreciate Hematology assistance  Active Problems:   COVID-19 virus infection - she is asymptomatic and this was found to be positive in the hospital - CRP only 1.1 -cont Vit C and Zinc    Melena- acute blood loss anemia- h/o GERS - multiple bloody BMs on  Sunday and none since - Hb now 7.5- she is asymptomatic- if Hb drops less than 7, will transfuse PRBC - check Hb later today - obtain anemia panel - cont Protonix- d/c Mobic    Hypertension -cont Amlodipine     Time spent in minutes: 35 DVT prophylaxis: SCDs Code Status: Full code Family Communication:  Disposition Plan: home when platelets stabilize  Consultants:   hematology Procedures:   none Antimicrobials:  Anti-infectives (From admission, onward)   None       Objective: Vitals:   02/06/19 0240 02/06/19 0339 02/06/19 0414 02/06/19 0535  BP: (!) 141/67 (!) 141/70 126/73 125/75  Pulse: 83 80 83 83  Resp: 16 15 16 16   Temp: (!) 100.5 F (38.1 C) 98.1 F (36.7 C) 99.4 F (37.4 C) 98.9 F (37.2 C)  TempSrc: Oral Oral Oral Oral  SpO2: 99% 98% 100% 98%  Weight:      Height:        Intake/Output Summary (Last 24 hours) at 02/06/2019 1211 Last data filed at 02/06/2019 0940 Gross per 24 hour  Intake 4722.63 ml  Output 1200 ml  Net 3522.63 ml   Filed Weights   02/05/19 2300  Weight: 97.5 kg    Examination: General exam: Appears comfortable  HEENT: PERRLA, oral mucosa moist, no sclera icterus or thrush Respiratory system: Clear to auscultation. Respiratory effort normal. Cardiovascular system: S1 &  S2 heard, RRR.   Gastrointestinal system: Abdomen soft, non-tender, nondistended. Normal bowel sounds. Central nervous system: Alert and oriented. No focal neurological deficits. Extremities: No cyanosis, clubbing or edema Skin: No rashes or ulcers Psychiatry:  Mood & affect appropriate.     Data Reviewed: I have personally reviewed following labs and imaging studies  CBC: Recent Labs  Lab 02/05/19 0952 02/05/19 1424 02/06/19 0800  WBC 10.3 14.6* 8.9  NEUTROABS 5,408  --  6.0  HGB 10.5* 10.5* 7.5*  HCT 31.6* 31.8* 23.2*  MCV 85.6 87.4 89.9  PLT 2* 1* 67*   Basic Metabolic Panel: Recent Labs  Lab 02/05/19 0952 02/05/19 1424 02/06/19 0800  NA  140 139 137  K 3.8 3.5 3.7  CL 104 104 104  CO2 25 25 25   GLUCOSE 158* 147* 140*  BUN 24 28* 21  CREATININE 0.97* 1.09* 0.90  CALCIUM 10.1 10.5* 9.5   GFR: Estimated Creatinine Clearance: 68.5 mL/min (by C-G formula based on SCr of 0.9 mg/dL). Liver Function Tests: Recent Labs  Lab 02/05/19 0952 02/05/19 1424 02/06/19 0800  AST 19 29 19   ALT 18 25 18   ALKPHOS  --  69 56  BILITOT 0.4 0.5 0.4  PROT 6.5 7.4 7.9  ALBUMIN  --  3.7 2.9*   No results for input(s): LIPASE, AMYLASE in the last 168 hours. No results for input(s): AMMONIA in the last 168 hours. Coagulation Profile: Recent Labs  Lab 02/05/19 1424  INR 1.1   Cardiac Enzymes: No results for input(s): CKTOTAL, CKMB, CKMBINDEX, TROPONINI in the last 168 hours. BNP (last 3 results) No results for input(s): PROBNP in the last 8760 hours. HbA1C: No results for input(s): HGBA1C in the last 72 hours. CBG: No results for input(s): GLUCAP in the last 168 hours. Lipid Profile: No results for input(s): CHOL, HDL, LDLCALC, TRIG, CHOLHDL, LDLDIRECT in the last 72 hours. Thyroid Function Tests: No results for input(s): TSH, T4TOTAL, FREET4, T3FREE, THYROIDAB in the last 72 hours. Anemia Panel: Recent Labs    02/05/19 1424 02/06/19 0800  FERRITIN  --  47  RETICCTPCT 2.0  --    Urine analysis:    Component Value Date/Time   COLORURINE YELLOW 08/16/2014 0924   APPEARANCEUR CLEAR 08/16/2014 0924   LABSPEC 1.015 08/16/2014 0924   PHURINE 6.0 08/16/2014 0924   GLUCOSEU NEGATIVE 08/16/2014 0924   HGBUR TRACE (A) 08/16/2014 0924   BILIRUBINUR NEGATIVE 08/16/2014 0924   KETONESUR NEGATIVE 08/16/2014 0924   PROTEINUR NEGATIVE 08/16/2014 0924   NITRITE NEGATIVE 08/16/2014 0924   LEUKOCYTESUR 1+ (A) 08/16/2014 0924   Sepsis Labs: @LABRCNTIP (procalcitonin:4,lacticidven:4) ) Recent Results (from the past 240 hour(s))  Respiratory Panel by RT PCR (Flu A&B, Covid) - Nasopharyngeal Swab     Status: Abnormal   Collection  Time: 02/05/19  3:05 PM   Specimen: Nasopharyngeal Swab  Result Value Ref Range Status   SARS Coronavirus 2 by RT PCR POSITIVE (A) NEGATIVE Final    Comment: RESULT CALLED TO, READ BACK BY AND VERIFIED WITH: P.DOWD AT 1606 ON 02/05/19 BY N.THOMPSON (NOTE) SARS-CoV-2 target nucleic acids are DETECTED. SARS-CoV-2 RNA is generally detectable in upper respiratory specimens  during the acute phase of infection. Positive results are indicative of the presence of the identified virus, but do not rule out bacterial infection or co-infection with other pathogens not detected by the test. Clinical correlation with patient history and other diagnostic information is necessary to determine patient infection status. The expected result is Negative. Fact Sheet for  Patients:  PinkCheek.be Fact Sheet for Healthcare Providers: GravelBags.it This test is not yet approved or cleared by the [place] FDA and  has been authorized for detection and/or diagnosis of SARS-CoV-2 by FDA under an Emergency Use Authorization (EUA).  This EUA will remain in effect (meaning this test can be use d) for the duration of  the COVID-19 declaration under Section 564(b)(1) of the Act, 21 U.S.C. section 360bbb-3(b)(1), unless the authorization is terminated or revoked sooner.    Influenza A by PCR NEGATIVE NEGATIVE Final   Influenza B by PCR NEGATIVE NEGATIVE Final    Comment: (NOTE) The Xpert Xpress SARS-CoV-2/FLU/RSV assay is intended as an aid in  the diagnosis of influenza from Nasopharyngeal swab specimens and  should not be used as a sole basis for treatment. Nasal washings and  aspirates are unacceptable for Xpert Xpress SARS-CoV-2/FLU/RSV  testing. Fact Sheet for Patients: PinkCheek.be Fact Sheet for Healthcare Providers: GravelBags.it This test is not yet approved or cleared by the [place] FDA and  has been authorized for detection and/or diagnosis of SARS-CoV-2 by  FDA under an Emergency Use Authorization (EUA). This EUA will remain  in effect (meaning this test can be used) for the duration of the  Covid-19 declaration under Section 564(b)(1) of the Act, 21  U.S.C. section 360bbb-3(b)(1), unless the authorization is  terminated or revoked. Performed at [facility], [place] [address], [place], [place] [address]          Radiology Studies: [facility] Chest Port 1 View  Result Date: 02/05/2019 CLINICAL DATA:  Black, tarry stools. EXAM: PORTABLE CHEST 1 VIEW COMPARISON:  October 04, 2011 FINDINGS: The heart size and mediastinal contours are within normal limits. Both lungs are clear. There is moderate severity calcification of the aortic arch. The visualized skeletal structures are unremarkable. IMPRESSION: No active disease. Electronically Signed   By: Virgina Norfolk M.D.   On: 02/05/2019 17:02      Scheduled Meds: . sodium chloride   Intravenous Once  . amLODipine  5 mg Oral Daily  . vitamin C  500 mg Oral Daily  . gabapentin  600 mg Oral BID  . Ipratropium-Albuterol  1 puff Inhalation Q6H  . pantoprazole  40 mg Oral Daily  . zinc sulfate  220 mg Oral Daily   Continuous Infusions: . sodium chloride 125 mL/hr at 02/06/19 0830  . IMMUNE GLOBULIN 10% (HUMAN) IV - For Fluid Restriction Only 58.5 mL/hr at 02/05/19 2250     LOS: 1 day      Debbe Odea, MD [facility] Pager: [email or web address] Password [facility] 02/06/2019, 12:11 PM

## 2019-02-06 NOTE — Plan of Care (Signed)
  Problem: Coping: Goal: Psychosocial and spiritual needs will be supported Outcome: Progressing   Problem: Respiratory: Goal: Will maintain a patent airway Outcome: Progressing   

## 2019-02-07 LAB — PREPARE PLATELET PHERESIS
Unit division: 0
Unit division: 0

## 2019-02-07 LAB — TYPE AND SCREEN
ABO/RH(D): A NEG
Antibody Screen: NEGATIVE
Unit division: 0

## 2019-02-07 LAB — COMPREHENSIVE METABOLIC PANEL
ALT: 18 U/L (ref 0–44)
AST: 20 U/L (ref 15–41)
Albumin: 2.7 g/dL — ABNORMAL LOW (ref 3.5–5.0)
Alkaline Phosphatase: 49 U/L (ref 38–126)
Anion gap: 4 — ABNORMAL LOW (ref 5–15)
BUN: 19 mg/dL (ref 8–23)
CO2: 24 mmol/L (ref 22–32)
Calcium: 9.2 mg/dL (ref 8.9–10.3)
Chloride: 109 mmol/L (ref 98–111)
Creatinine, Ser: 0.88 mg/dL (ref 0.44–1.00)
GFR calc Af Amer: >60 mL/min (ref >60)
GFR calc non Af Amer: >60 mL/min (ref >60)
Glucose, Bld: 135 mg/dL — ABNORMAL HIGH (ref 70–99)
Potassium: 3.3 mmol/L — ABNORMAL LOW (ref 3.5–5.1)
Sodium: 137 mmol/L (ref 135–145)
Total Bilirubin: 0.4 mg/dL (ref 0.3–1.2)
Total Protein: 8.7 g/dL — ABNORMAL HIGH (ref 6.5–8.1)

## 2019-02-07 LAB — BPAM PLATELET PHERESIS
Blood Product Expiration Date: 202101262359
Blood Product Expiration Date: 202101282359
ISSUE DATE / TIME: 202101260354
Unit Type and Rh: 600
Unit Type and Rh: 8400

## 2019-02-07 LAB — HEMOGLOBIN AND HEMATOCRIT, BLOOD
HCT: 27.4 % — ABNORMAL LOW (ref 36.0–46.0)
Hemoglobin: 9 g/dL — ABNORMAL LOW (ref 12.0–15.0)

## 2019-02-07 LAB — CBC WITH DIFFERENTIAL/PLATELET
Abs Immature Granulocytes: 0.09 10*3/uL — ABNORMAL HIGH (ref 0.00–0.07)
Basophils Absolute: 0 10*3/uL (ref 0.0–0.1)
Basophils Relative: 1 %
Eosinophils Absolute: 0.2 10*3/uL (ref 0.0–0.5)
Eosinophils Relative: 4 %
HCT: 23.7 % — ABNORMAL LOW (ref 36.0–46.0)
Hemoglobin: 7.7 g/dL — ABNORMAL LOW (ref 12.0–15.0)
Immature Granulocytes: 2 %
Lymphocytes Relative: 30 %
Lymphs Abs: 1.9 10*3/uL (ref 0.7–4.0)
MCH: 29.5 pg (ref 26.0–34.0)
MCHC: 32.5 g/dL (ref 30.0–36.0)
MCV: 90.8 fL (ref 80.0–100.0)
Monocytes Absolute: 0.4 10*3/uL (ref 0.1–1.0)
Monocytes Relative: 7 %
Neutro Abs: 3.4 10*3/uL (ref 1.7–7.7)
Neutrophils Relative %: 56 %
Platelets: 107 10*3/uL — ABNORMAL LOW (ref 150–400)
RBC: 2.61 MIL/uL — ABNORMAL LOW (ref 3.87–5.11)
RDW: 13.9 % (ref 11.5–15.5)
WBC: 6.1 10*3/uL (ref 4.0–10.5)
nRBC: 0 % (ref 0.0–0.2)

## 2019-02-07 LAB — BPAM RBC
Blood Product Expiration Date: 202102182359
ISSUE DATE / TIME: 202101261347
Unit Type and Rh: 600

## 2019-02-07 LAB — D-DIMER, QUANTITATIVE: D-Dimer, Quant: 0.64 ug/mL-FEU — ABNORMAL HIGH (ref 0.00–0.50)

## 2019-02-07 LAB — C-REACTIVE PROTEIN: CRP: 1 mg/dL — ABNORMAL HIGH (ref <1.0)

## 2019-02-07 LAB — VITAMIN B12: Vitamin B-12: 1075 pg/mL — ABNORMAL HIGH (ref 180–914)

## 2019-02-07 LAB — FOLATE: Folate: 14.2 ng/mL (ref >5.9)

## 2019-02-07 LAB — FERRITIN: Ferritin: 46 ng/mL (ref 11–307)

## 2019-02-07 MED ORDER — ACETAMINOPHEN 325 MG PO TABS
650.0000 mg | ORAL_TABLET | Freq: Four times a day (QID) | ORAL | Status: DC | PRN
  Administered 2019-02-07 – 2019-02-08 (×2): 650 mg via ORAL
  Filled 2019-02-07 (×2): qty 2

## 2019-02-07 MED ORDER — POTASSIUM CHLORIDE CRYS ER 20 MEQ PO TBCR
40.0000 meq | EXTENDED_RELEASE_TABLET | Freq: Once | ORAL | Status: AC
  Administered 2019-02-07: 15:00:00 40 meq via ORAL
  Filled 2019-02-07: qty 2

## 2019-02-07 NOTE — Progress Notes (Signed)
MD notified of report from tele monitoring of 1st degree heart block, physician reviewed monitor. States pt's in nsr and we will continue to monitor.

## 2019-02-07 NOTE — Plan of Care (Signed)
  Problem: Education: Goal: Knowledge of risk factors and measures for prevention of condition will improve Outcome: Progressing   Problem: Coping: Goal: Psychosocial and spiritual needs will be supported Outcome: Progressing   Problem: Respiratory: Goal: Will maintain a patent airway Outcome: Progressing   

## 2019-02-07 NOTE — Progress Notes (Signed)
PROGRESS NOTE    Kelli Terrell  [id number] DOB: [birth year] DOA: 02/05/2019 PCP: Susy Frizzle, MD    Brief Narrative:  69 y.o.femalewith PMH significant for HTN, HLD, GERD, AVM of colon, internal hemorrhoids. Patient was sent to the ED by her primary care provider today for severe thrombocytopenia.  Patient states that she had been taking meloxicam for the last 8 days,the last on Friday, 02/02/19.She noticed 2 dime size raised areas on the left side of her mouth that day.She alsonoticed petechia on her legs, and started having progressive weakness. Patientnoticed black tarry stool last night.  Patient went to her PCPs office. She was noted to have hemorrhagic bullae in her mouth. CBC and CMP was done. Compared to her normal blood work from 2 weeks ago, herhgb was noted to havedropped down from 13.3 to 10.3, and her platelets have dropped from >300 to 2.  In the ED, patient was afebrile.Breathing comfortably in room air. Blood pressure elevated as high as 159/85.  Assessment & Plan:   Principal Problem:   Thrombocytopenia (HCC) Active Problems:   Hypertension   GERD (gastroesophageal reflux disease)   COVID-19 virus infection   Melena  Principal Problem:   Thrombocytopenia (ITP) in setting of COVID 19 infection- With petechial rash on legs - she presented with platelets of 2- last platelets were 363 on 1/11 - Appreciate input by Hematology - Pt was started on IVIG and given 1 U of pheresed platelets. Platelets increased to 107 - No evidence of acute bleeding -Repeat cbc in AM  Active Problems:   COVID-19 virus infection - she is asymptomatic and this was found to be positive in the hospital - CRP noted to be 1 -cont Vit C and Zinc -CXR reviewed. Clear    Melena- acute blood loss anemia- h/o GERS - multiple bloody BMs on Sunday and none since - Hb down to 7.5 from 10.5, received one unit of prbc with follow up hgb of 9.0 -No further bleeding at  this time -repeat CBC in AM - cont Protonix- d/c Mobic    Hypertension -cont Amlodipine as tolerated  DVT prophylaxis: SCD's Code Status: Full Family Communication: Pt in room, family not at bedside Disposition Plan:  Home when cleared by Hematology  Consultants:   Hematology  Procedures:     Antimicrobials: Anti-infectives (From admission, onward)   None       Subjective: Without complaints at this time  Objective: Vitals:   02/06/19 2102 02/06/19 2201 02/07/19 0606 02/07/19 1439  BP: (!) 145/70 (!) 146/73 (!) 155/72 (!) 154/77  Pulse: 77 82 85 78  Resp: 17 17 20  (!) 21  Temp: (!) 97.2 F (36.2 C) 98.6 F (37 C) 98.1 F (36.7 C) 97.7 F (36.5 C)  TempSrc: Oral Oral Oral Oral  SpO2: 99% 100% 98% 100%  Weight:      Height:        Intake/Output Summary (Last 24 hours) at 02/07/2019 1809 Last data filed at 02/07/2019 1608 Gross per 24 hour  Intake 1895.94 ml  Output 300 ml  Net 1595.94 ml   Filed Weights   02/05/19 2300  Weight: 97.5 kg    Examination: General exam: Appears calm and comfortable  Respiratory system: Clear to auscultation. Respiratory effort normal. Cardiovascular system: S1 & S2 heard, Regular Gastrointestinal system: Abdomen is nondistended, soft and nontender. No organomegaly or masses felt. Normal bowel sounds heard. Central nervous system: Alert and oriented. No focal neurological deficits. Extremities: Symmetric 5 x 5 power.  Skin: No rashes, lesions Psychiatry: Judgement and insight appear normal. Mood & affect appropriate.   Data Reviewed: I have personally reviewed following labs and imaging studies  CBC: Recent Labs  Lab 02/05/19 0952 02/05/19 1424 02/06/19 0800 02/07/19 0034 02/07/19 1232  WBC 10.3 14.6* 8.9 6.1  --   NEUTROABS 5,408  --  6.0 3.4  --   HGB 10.5* 10.5* 7.5* 7.7* 9.0*  HCT 31.6* 31.8* 23.2* 23.7* 27.4*  MCV 85.6 87.4 89.9 90.8  --   PLT 2* 1* 67* 107*  --    Basic Metabolic Panel: Recent Labs    Lab 02/05/19 0952 02/05/19 1424 02/06/19 0800 02/07/19 0034  NA 140 139 137 137  K 3.8 3.5 3.7 3.3*  CL 104 104 104 109  CO2 25 25 25 24   GLUCOSE 158* 147* 140* 135*  BUN 24 28* 21 19  CREATININE 0.97* 1.09* 0.90 0.88  CALCIUM 10.1 10.5* 9.5 9.2   GFR: Estimated Creatinine Clearance: 70.1 mL/min (by C-G formula based on SCr of 0.88 mg/dL). Liver Function Tests: Recent Labs  Lab 02/05/19 0952 02/05/19 1424 02/06/19 0800 02/07/19 0034  AST 19 29 19 20   ALT 18 25 18 18   ALKPHOS  --  69 56 49  BILITOT 0.4 0.5 0.4 0.4  PROT 6.5 7.4 7.9 8.7*  ALBUMIN  --  3.7 2.9* 2.7*   No results for input(s): LIPASE, AMYLASE in the last 168 hours. No results for input(s): AMMONIA in the last 168 hours. Coagulation Profile: Recent Labs  Lab 02/05/19 1424  INR 1.1   Cardiac Enzymes: No results for input(s): CKTOTAL, CKMB, CKMBINDEX, TROPONINI in the last 168 hours. BNP (last 3 results) No results for input(s): PROBNP in the last 8760 hours. HbA1C: No results for input(s): HGBA1C in the last 72 hours. CBG: No results for input(s): GLUCAP in the last 168 hours. Lipid Profile: No results for input(s): CHOL, HDL, LDLCALC, TRIG, CHOLHDL, LDLDIRECT in the last 72 hours. Thyroid Function Tests: No results for input(s): TSH, T4TOTAL, FREET4, T3FREE, THYROIDAB in the last 72 hours. Anemia Panel: Recent Labs    02/05/19 1424 02/06/19 0800 02/06/19 1342 02/07/19 0034  VITAMINB12  --   --   --  1,075*  FOLATE  --   --   --  14.2  FERRITIN  --  47  --  46  TIBC  --   --  292  --   IRON  --   --  61  --   RETICCTPCT 2.0  --  2.3  --    Sepsis Labs: No results for input(s): PROCALCITON, LATICACIDVEN in the last 168 hours.  Recent Results (from the past 240 hour(s))  Respiratory Panel by RT PCR (Flu A&B, Covid) - Nasopharyngeal Swab     Status: Abnormal   Collection Time: 02/05/19  3:05 PM   Specimen: Nasopharyngeal Swab  Result Value Ref Range Status   SARS Coronavirus 2 by RT  PCR POSITIVE (A) NEGATIVE Final    Comment: RESULT CALLED TO, READ BACK BY AND VERIFIED WITH: P.DOWD AT 1606 ON 02/05/19 BY N.THOMPSON (NOTE) SARS-CoV-2 target nucleic acids are DETECTED. SARS-CoV-2 RNA is generally detectable in upper respiratory specimens  during the acute phase of infection. Positive results are indicative of the presence of the identified virus, but do not rule out bacterial infection or co-infection with other pathogens not detected by the test. Clinical correlation with patient history and other diagnostic information is necessary to determine patient infection status. The expected  result is Negative. Fact Sheet for Patients:  PinkCheek.be Fact Sheet for Healthcare Providers: GravelBags.it This test is not yet approved or cleared by the [place] FDA and  has been authorized for detection and/or diagnosis of SARS-CoV-2 by FDA under an Emergency Use Authorization (EUA).  This EUA will remain in effect (meaning this test can be use d) for the duration of  the COVID-19 declaration under Section 564(b)(1) of the Act, 21 U.S.C. section 360bbb-3(b)(1), unless the authorization is terminated or revoked sooner.    Influenza A by PCR NEGATIVE NEGATIVE Final   Influenza B by PCR NEGATIVE NEGATIVE Final    Comment: (NOTE) The Xpert Xpress SARS-CoV-2/FLU/RSV assay is intended as an aid in  the diagnosis of influenza from Nasopharyngeal swab specimens and  should not be used as a sole basis for treatment. Nasal washings and  aspirates are unacceptable for Xpert Xpress SARS-CoV-2/FLU/RSV  testing. Fact Sheet for Patients: PinkCheek.be Fact Sheet for Healthcare Providers: GravelBags.it This test is not yet approved or cleared by the [place] FDA and  has been authorized for detection and/or diagnosis of SARS-CoV-2 by  FDA under an Emergency Use  Authorization (EUA). This EUA will remain  in effect (meaning this test can be used) for the duration of the  Covid-19 declaration under Section 564(b)(1) of the Act, 21  U.S.C. section 360bbb-3(b)(1), unless the authorization is  terminated or revoked. Performed at [facility], [place] [address] [place], [place] [address]      Radiology Studies: No results found.  Scheduled Meds: . sodium chloride   Intravenous Once  . amLODipine  5 mg Oral Daily  . vitamin C  500 mg Oral Daily  . gabapentin  600 mg Oral BID  . Ipratropium-Albuterol  1 puff Inhalation Q6H  . pantoprazole  40 mg Oral Daily  . zinc sulfate  220 mg Oral Daily   Continuous Infusions:   LOS: 2 days   Marylu Lund, MD [facility] Pager On Amion  If 7PM-7AM, please contact night-coverage 02/07/2019, 6:09 PM

## 2019-02-07 NOTE — TOC Progression Note (Signed)
Transition of Care [facility]) - Progression Note    Patient Details  Name: Kelli Terrell MRN: [id number] Date of Birth: March 12, 1950  [facility] CM/SW Contact  Purcell Mouton, RN Phone Number: 02/07/2019, 3:22 PM  Clinical Narrative:     TOC will follow for discharge needs.        Expected Discharge Plan and Services                                                 Social Determinants of Health (SDOH) Interventions    Readmission Risk Interventions No flowsheet data found.

## 2019-02-08 LAB — COMPREHENSIVE METABOLIC PANEL
ALT: 20 U/L (ref 0–44)
AST: 25 U/L (ref 15–41)
Albumin: 3 g/dL — ABNORMAL LOW (ref 3.5–5.0)
Alkaline Phosphatase: 56 U/L (ref 38–126)
Anion gap: 6 (ref 5–15)
BUN: 15 mg/dL (ref 8–23)
CO2: 23 mmol/L (ref 22–32)
Calcium: 9.7 mg/dL (ref 8.9–10.3)
Chloride: 108 mmol/L (ref 98–111)
Creatinine, Ser: 0.97 mg/dL (ref 0.44–1.00)
GFR calc Af Amer: >60 mL/min (ref >60)
GFR calc non Af Amer: 59 mL/min — ABNORMAL LOW (ref >60)
Glucose, Bld: 124 mg/dL — ABNORMAL HIGH (ref 70–99)
Potassium: 3.9 mmol/L (ref 3.5–5.1)
Sodium: 137 mmol/L (ref 135–145)
Total Bilirubin: 0.6 mg/dL (ref 0.3–1.2)
Total Protein: 8.8 g/dL — ABNORMAL HIGH (ref 6.5–8.1)

## 2019-02-08 LAB — CBC
HCT: 27.5 % — ABNORMAL LOW (ref 36.0–46.0)
Hemoglobin: 8.9 g/dL — ABNORMAL LOW (ref 12.0–15.0)
MCH: 28.6 pg (ref 26.0–34.0)
MCHC: 32.4 g/dL (ref 30.0–36.0)
MCV: 88.4 fL (ref 80.0–100.0)
Platelets: 282 10*3/uL (ref 150–400)
RBC: 3.11 MIL/uL — ABNORMAL LOW (ref 3.87–5.11)
RDW: 14 % (ref 11.5–15.5)
WBC: 6.5 10*3/uL (ref 4.0–10.5)
nRBC: 0 % (ref 0.0–0.2)

## 2019-02-08 LAB — CBC WITH DIFFERENTIAL/PLATELET
Abs Immature Granulocytes: 0.12 10*3/uL — ABNORMAL HIGH (ref 0.00–0.07)
Basophils Absolute: 0.1 10*3/uL (ref 0.0–0.1)
Basophils Relative: 1 %
Eosinophils Absolute: 0.3 10*3/uL (ref 0.0–0.5)
Eosinophils Relative: 5 %
HCT: 26 % — ABNORMAL LOW (ref 36.0–46.0)
Hemoglobin: 8.3 g/dL — ABNORMAL LOW (ref 12.0–15.0)
Immature Granulocytes: 2 %
Lymphocytes Relative: 30 %
Lymphs Abs: 2 10*3/uL (ref 0.7–4.0)
MCH: 28.3 pg (ref 26.0–34.0)
MCHC: 31.9 g/dL (ref 30.0–36.0)
MCV: 88.7 fL (ref 80.0–100.0)
Monocytes Absolute: 0.7 10*3/uL (ref 0.1–1.0)
Monocytes Relative: 11 %
Neutro Abs: 3.4 10*3/uL (ref 1.7–7.7)
Neutrophils Relative %: 51 %
Platelets: 223 10*3/uL (ref 150–400)
RBC: 2.93 MIL/uL — ABNORMAL LOW (ref 3.87–5.11)
RDW: 13.8 % (ref 11.5–15.5)
WBC: 6.5 10*3/uL (ref 4.0–10.5)
nRBC: 0 % (ref 0.0–0.2)

## 2019-02-08 LAB — D-DIMER, QUANTITATIVE: D-Dimer, Quant: 0.53 ug/mL-FEU — ABNORMAL HIGH (ref 0.00–0.50)

## 2019-02-08 LAB — C-REACTIVE PROTEIN: CRP: 0.9 mg/dL (ref <1.0)

## 2019-02-08 LAB — FERRITIN: Ferritin: 54 ng/mL (ref 11–307)

## 2019-02-08 MED ORDER — VITAMIN C 1000 MG PO TABS: 1000.0000 mg | ORAL_TABLET | Freq: Every day | ORAL | 0 refills | Status: AC

## 2019-02-08 MED ORDER — PANTOPRAZOLE SODIUM 40 MG PO TBEC: 40.0000 mg | DELAYED_RELEASE_TABLET | Freq: Two times a day (BID) | ORAL | 0 refills | Status: DC

## 2019-02-08 MED ORDER — ZINC SULFATE 220 (50 ZN) MG PO CAPS: 220.0000 mg | ORAL_CAPSULE | Freq: Every day | ORAL | 0 refills | Status: AC

## 2019-02-08 MED ORDER — PANTOPRAZOLE SODIUM 40 MG PO TBEC: 40.0000 mg | DELAYED_RELEASE_TABLET | Freq: Every day | ORAL | 0 refills | Status: DC

## 2019-02-08 NOTE — Discharge Summary (Signed)
Physician Discharge Summary  Kelli Terrell [id number] DOB: 27-Mar-1950 DOA: 02/05/2019  PCP: Susy Frizzle, MD  Admit date: 02/05/2019 Discharge date: 02/08/2019  Admitted From: Home Disposition:  Home  Recommendations for Outpatient Follow-up:  Follow up with PCP in 2-3 weeks Follow up with primary gastroenterologist  Discharge Condition:Improved CODE STATUS:Full Diet recommendation: Regular as tolerated   Brief/Interim Summary: 69 y.o.femalewith PMH significant for HTN, HLD, GERD, AVM of colon, internal hemorrhoids. Patient was sent to the ED by her primary care provider today for severe thrombocytopenia.  Patient states that she had been taking meloxicam for the last 8 days,the last on Friday, 02/02/19.She noticed 2 dime size raised areas on the left side of her mouth that day.She alsonoticed petechia on her legs, and started having progressive weakness. Patientnoticed black tarry stool last night.  Patient went to her PCPs office.She was noted to have hemorrhagic bullae in her mouth. CBC and CMP was done. Compared to her normal blood work from 2 weeks ago, herhgb was noted to havedropped down from 13.3 to 10.3, and her platelets have dropped from >300 to 2.  In the ED, patient was afebrile.Breathing comfortably in room air. Blood pressure elevated as high as 159/85.  Discharge Diagnoses:  Principal Problem:   Thrombocytopenia (HCC) Active Problems:   Hypertension   GERD (gastroesophageal reflux disease)   COVID-19 virus infection   Melena  Principal Problem: Thrombocytopenia in setting of COVID 19 infection- With petechial rash on legs - she presented with platelets of 2- last platelets were 363 on 1/11 - Appreciate input by Hematology - Pt was started on IVIG and given 1 U of pheresed platelets. Platelets increased improved to 282 at time of d/c - No evidence of further acute bleeding, see below  Active Problems: COVID-19 virus infection -  she is asymptomatic and this was found to be positive in the hospital - CRP noted to be 1 -cont Vit C and Zinc -CXR reviewed. Clear  Melena- acute blood loss anemia- h/o GERS - multiple bloody BMs on Sunday and none since - Hb down to 7.5 from 10.5, received one unit of prbc with follow up hgb of 9.0 and repeat of 8.3 -Pt passed marroon clots on 1/29. No abd pain or nausea -repeat CBC with hgb up to 8.9. Suspecting passage of old blood from when plts were 1 -Pt has been instructed to follow up with her primary gastroenterologist for follow up -Have prescribed BID protonix x 14 days followed by once daily afterwards  Hypertension -cont Amlodipine as tolerated  Discharge Instructions   Allergies as of 02/08/2019      Reactions   Codeine Nausea And Vomiting      Medication List    STOP taking these medications   aspirin 81 MG tablet   meloxicam 15 MG tablet Commonly known as: MOBIC   zinc gluconate 50 MG tablet     TAKE these medications   amLODipine 5 MG tablet Commonly known as: NORVASC TAKE 1 TABLET BY MOUTH EVERY DAY   B Complex Caps Take by mouth.   Biotin 10 MG Tabs Take 10 mg by mouth daily.   gabapentin 300 MG capsule Commonly known as: NEURONTIN TAKE 2 CAPSULES (600 MG TOTAL) BY MOUTH 3 (THREE) TIMES DAILY. What changed: when to take this   hydrochlorothiazide 25 MG tablet Commonly known as: HYDRODIURIL TAKE 1 TABLET BY MOUTH EVERY DAY   mometasone 0.1 % cream Commonly known as: Elocon Apply 1 application topically daily.  omeprazole 40 MG capsule Commonly known as: PRILOSEC TAKE ONE CAPSULE BY MOUTH EVERY DAY What changed: when to take this   vitamin C 1000 MG tablet Take 1 tablet (1,000 mg total) by mouth daily for 7 days.   Vitamin D 50 MCG (2000 UT) tablet Take 2,000 Units by mouth daily.   zinc sulfate 220 (50 Zn) MG capsule Take 1 capsule (220 mg total) by mouth daily for 7 days. Start taking on: February 09, 2019        Allergies  Allergen Reactions  . Codeine Nausea And Vomiting   Procedures/Studies: DG Chest Port 1 View  Result Date: 02/05/2019 CLINICAL DATA:  Black, tarry stools. EXAM: PORTABLE CHEST 1 VIEW COMPARISON:  October 04, 2011 FINDINGS: The heart size and mediastinal contours are within normal limits. Both lungs are clear. There is moderate severity calcification of the aortic arch. The visualized skeletal structures are unremarkable. IMPRESSION: No active disease. Electronically Signed   By: Virgina Norfolk M.D.   On: 02/05/2019 17:02     Subjective: Eager to go home  Discharge Exam: Vitals:   02/08/19 1328 02/08/19 1328  BP: 126/86 126/86  Pulse: 70 73  Resp:    Temp:  97.9 F (36.6 C)  SpO2: 99% 99%   Vitals:   02/07/19 2137 02/08/19 0559 02/08/19 1328 02/08/19 1328  BP: (!) 159/80 132/64 126/86 126/86  Pulse: 76 77 70 73  Resp: 15 16    Temp: 98.3 F (36.8 C) 98.6 F (37 C)  97.9 F (36.6 C)  TempSrc: Oral Oral  Oral  SpO2: 98% 95% 99% 99%  Weight:      Height:        General: Pt is alert, awake, not in acute distress Cardiovascular: RRR, S1/S2 +, no rubs, no gallops Respiratory: CTA bilaterally, no wheezing, no rhonchi Abdominal: Soft, NT, ND, bowel sounds + Extremities: no edema, no cyanosis   The results of significant diagnostics from this hospitalization (including imaging, microbiology, ancillary and laboratory) are listed below for reference.     Microbiology: Recent Results (from the past 240 hour(s))  Respiratory Panel by RT PCR (Flu A&B, Covid) - Nasopharyngeal Swab     Status: Abnormal   Collection Time: 02/05/19  3:05 PM   Specimen: Nasopharyngeal Swab  Result Value Ref Range Status   SARS Coronavirus 2 by RT PCR POSITIVE (A) NEGATIVE Final    Comment: RESULT CALLED TO, READ BACK BY AND VERIFIED WITH: P.DOWD AT 1606 ON 02/05/19 BY N.THOMPSON (NOTE) SARS-CoV-2 target nucleic acids are DETECTED. SARS-CoV-2 RNA is generally detectable in  upper respiratory specimens  during the acute phase of infection. Positive results are indicative of the presence of the identified virus, but do not rule out bacterial infection or co-infection with other pathogens not detected by the test. Clinical correlation with patient history and other diagnostic information is necessary to determine patient infection status. The expected result is Negative. Fact Sheet for Patients:  PinkCheek.be Fact Sheet for Healthcare Providers: GravelBags.it This test is not yet approved or cleared by the [place] FDA and  has been authorized for detection and/or diagnosis of SARS-CoV-2 by FDA under an Emergency Use Authorization (EUA).  This EUA will remain in effect (meaning this test can be use d) for the duration of  the COVID-19 declaration under Section 564(b)(1) of the Act, 21 U.S.C. section 360bbb-3(b)(1), unless the authorization is terminated or revoked sooner.    Influenza A by PCR NEGATIVE NEGATIVE Final   Influenza B  by PCR NEGATIVE NEGATIVE Final    Comment: (NOTE) The Xpert Xpress SARS-CoV-2/FLU/RSV assay is intended as an aid in  the diagnosis of influenza from Nasopharyngeal swab specimens and  should not be used as a sole basis for treatment. Nasal washings and  aspirates are unacceptable for Xpert Xpress SARS-CoV-2/FLU/RSV  testing. Fact Sheet for Patients: PinkCheek.be Fact Sheet for Healthcare Providers: GravelBags.it This test is not yet approved or cleared by the [place] FDA and  has been authorized for detection and/or diagnosis of SARS-CoV-2 by  FDA under an Emergency Use Authorization (EUA). This EUA will remain  in effect (meaning this test can be used) for the duration of the  Covid-19 declaration under Section 564(b)(1) of the Act, 21  U.S.C. section 360bbb-3(b)(1), unless the authorization is   terminated or revoked. Performed at [facility], [place] [address], [place], [place] [address]      Labs: BNP (last 3 results) No results for input(s): BNP in the last 8760 hours. Basic Metabolic Panel: Recent Labs  Lab 02/05/19 0952 02/05/19 1424 02/06/19 0800 02/07/19 0034 02/08/19 0039  NA 140 139 137 137 137  K 3.8 3.5 3.7 3.3* 3.9  CL 104 104 104 109 108  CO2 25 25 25 24 23   GLUCOSE 158* 147* 140* 135* 124*  BUN 24 28* 21 19 15   CREATININE 0.97* 1.09* 0.90 0.88 0.97  CALCIUM 10.1 10.5* 9.5 9.2 9.7   Liver Function Tests: Recent Labs  Lab 02/05/19 0952 02/05/19 1424 02/06/19 0800 02/07/19 0034 02/08/19 0039  AST 19 29 19 20 25   ALT 18 25 18 18 20   ALKPHOS  --  69 56 49 56  BILITOT 0.4 0.5 0.4 0.4 0.6  PROT 6.5 7.4 7.9 8.7* 8.8*  ALBUMIN  --  3.7 2.9* 2.7* 3.0*   No results for input(s): LIPASE, AMYLASE in the last 168 hours. No results for input(s): AMMONIA in the last 168 hours. CBC: Recent Labs  Lab 02/05/19 0952 02/05/19 0952 02/05/19 1424 02/05/19 1424 02/06/19 0800 02/07/19 0034 02/07/19 1232 02/08/19 0039 02/08/19 1323  WBC 10.3   < > 14.6*  --  8.9 6.1  --  6.5 6.5  NEUTROABS 5,408  --   --   --  6.0 3.4  --  3.4  --   HGB 10.5*   < > 10.5*   < > 7.5* 7.7* 9.0* 8.3* 8.9*  HCT 31.6*   < > 31.8*   < > 23.2* 23.7* 27.4* 26.0* 27.5*  MCV 85.6   < > 87.4  --  89.9 90.8  --  88.7 88.4  PLT 2*   < > 1*  --  67* 107*  --  223 282   < > = values in this interval not displayed.   Cardiac Enzymes: No results for input(s): CKTOTAL, CKMB, CKMBINDEX, TROPONINI in the last 168 hours. BNP: Invalid input(s): POCBNP CBG: No results for input(s): GLUCAP in the last 168 hours. D-Dimer Recent Labs    02/07/19 0034 02/08/19 0039  DDIMER 0.64* 0.53*   Hgb A1c No results for input(s): HGBA1C in the last 72 hours. Lipid Profile No results for input(s): CHOL, HDL, LDLCALC, TRIG, CHOLHDL, LDLDIRECT in the last 72 hours. Thyroid  function studies No results for input(s): TSH, T4TOTAL, T3FREE, THYROIDAB in the last 72 hours.  Invalid input(s): FREET3 Anemia work up Recent Labs    02/06/19 0800 02/06/19 1342 02/07/19 0034 02/08/19 0039  [id number]  --   --  1,075*  --  FOLATE  --   --  14.2  --   FERRITIN   < >  --  46 54  TIBC  --  292  --   --   IRON  --  61  --   --   RETICCTPCT  --  2.3  --   --    < > = values in this interval not displayed.   Urinalysis    Component Value Date/Time   COLORURINE YELLOW 08/16/2014 [place] 08/16/2014 0924   LABSPEC 1.015 08/16/2014 0924   PHURINE 6.0 08/16/2014 0924   GLUCOSEU NEGATIVE 08/16/2014 0924   HGBUR TRACE (A) 08/16/2014 0924   BILIRUBINUR NEGATIVE 08/16/2014 0924   KETONESUR NEGATIVE 08/16/2014 0924   PROTEINUR NEGATIVE 08/16/2014 0924   NITRITE NEGATIVE 08/16/2014 0924   LEUKOCYTESUR 1+ (A) 08/16/2014 0924   Sepsis Labs Invalid input(s): PROCALCITONIN,  WBC,  LACTICIDVEN Microbiology Recent Results (from the past 240 hour(s))  Respiratory Panel by RT PCR (Flu A&B, Covid) - Nasopharyngeal Swab     Status: Abnormal   Collection Time: 02/05/19  3:05 PM   Specimen: Nasopharyngeal Swab  Result Value Ref Range Status   SARS Coronavirus 2 by RT PCR POSITIVE (A) NEGATIVE Final    Comment: RESULT CALLED TO, READ BACK BY AND VERIFIED WITH: P.DOWD AT 1606 ON 02/05/19 BY N.THOMPSON (NOTE) SARS-CoV-2 target nucleic acids are DETECTED. SARS-CoV-2 RNA is generally detectable in upper respiratory specimens  during the acute phase of infection. Positive results are indicative of the presence of the identified virus, but do not rule out bacterial infection or co-infection with other pathogens not detected by the test. Clinical correlation with patient history and other diagnostic information is necessary to determine patient infection status. The expected result is Negative. Fact Sheet for Patients:   PinkCheek.be Fact Sheet for Healthcare Providers: GravelBags.it This test is not yet approved or cleared by the [place] FDA and  has been authorized for detection and/or diagnosis of SARS-CoV-2 by FDA under an Emergency Use Authorization (EUA).  This EUA will remain in effect (meaning this test can be use d) for the duration of  the COVID-19 declaration under Section 564(b)(1) of the Act, 21 U.S.C. section 360bbb-3(b)(1), unless the authorization is terminated or revoked sooner.    Influenza A by PCR NEGATIVE NEGATIVE Final   Influenza B by PCR NEGATIVE NEGATIVE Final    Comment: (NOTE) The Xpert Xpress SARS-CoV-2/FLU/RSV assay is intended as an aid in  the diagnosis of influenza from Nasopharyngeal swab specimens and  should not be used as a sole basis for treatment. Nasal washings and  aspirates are unacceptable for Xpert Xpress SARS-CoV-2/FLU/RSV  testing. Fact Sheet for Patients: PinkCheek.be Fact Sheet for Healthcare Providers: GravelBags.it This test is not yet approved or cleared by the [place] FDA and  has been authorized for detection and/or diagnosis of SARS-CoV-2 by  FDA under an Emergency Use Authorization (EUA). This EUA will remain  in effect (meaning this test can be used) for the duration of the  Covid-19 declaration under Section 564(b)(1) of the Act, 21  U.S.C. section 360bbb-3(b)(1), unless the authorization is  terminated or revoked. Performed at [facility], [place] [address] [place],  [address]    Time spent: 30 min  SIGNED:   Marylu Lund, MD  [facility] 02/08/2019, 2:24 PM  If 7PM-7AM, please contact night-coverage

## 2019-02-08 NOTE — Progress Notes (Signed)
Dr Wyline Copas notified of pt's abnormal stool.

## 2019-02-08 NOTE — Progress Notes (Signed)
Pt a& o x 4. Discharge instructions given pt verbalized understanding.

## 2019-02-08 NOTE — Progress Notes (Signed)
Brief Hematology Note:  The patient's chart and labs have been reviewed.  Platelet count has now normalized.  She has received 1 unit of platelets and 2 doses of IVIG.  No further treatment is indicated for her thrombocytopenia.  The patient remains anemic.  There is no evidence of hemolysis noted on her lab work.  She has had some GI bleeding.  She is status post 1 unit PRBCs.  Recommend supportive transfusions for hemoglobin less than 8 or active bleeding.  Also recommend considering GI consult if GI bleeding persists.  CBC    Component Value Date/Time   WBC 6.5 02/08/2019 0039   RBC 2.93 (L) 02/08/2019 0039   HGB 8.3 (L) 02/08/2019 0039   HCT 26.0 (L) 02/08/2019 0039   PLT 223 02/08/2019 0039   MCV 88.7 02/08/2019 0039   MCH 28.3 02/08/2019 0039   MCHC 31.9 02/08/2019 0039   RDW 13.8 02/08/2019 0039   LYMPHSABS 2.0 02/08/2019 0039   MONOABS 0.7 02/08/2019 0039   EOSABS 0.3 02/08/2019 0039   BASOSABS 0.1 02/08/2019 0039    Hematology will sign off at this time.  Please call for questions.  Outpatient follow-up will be as needed.  Mikey Bussing, DNP, AGPCNP-BC, AOCNP

## 2019-02-08 NOTE — Care Management Important Message (Signed)
Important Message  Patient Details IM Letter given to Evette Cristal SW Case Manager to present to the Patient Name: Kelli Terrell MRN: [id number] Date of Birth: 12/17/1950   Medicare Important Message Given:  Yes     Kerin Salen 02/08/2019, 9:52 AM

## 2019-02-19 ENCOUNTER — Other Ambulatory Visit: Payer: Self-pay | Admitting: Family Medicine

## 2019-02-19 ENCOUNTER — Other Ambulatory Visit: Payer: Medicare Other

## 2019-02-19 ENCOUNTER — Other Ambulatory Visit: Payer: Self-pay

## 2019-02-19 DIAGNOSIS — D696 Thrombocytopenia, unspecified: Secondary | ICD-10-CM | POA: Diagnosis not present

## 2019-02-19 LAB — CBC WITH DIFFERENTIAL/PLATELET
Absolute Monocytes: 920 cells/uL (ref 200–950)
Basophils Absolute: 90 cells/uL (ref 0–200)
Basophils Relative: 0.9 %
Eosinophils Absolute: 160 cells/uL (ref 15–500)
Eosinophils Relative: 1.6 %
HCT: 32.2 % — ABNORMAL LOW (ref 35.0–45.0)
Hemoglobin: 10.2 g/dL — ABNORMAL LOW (ref 11.7–15.5)
Lymphs Abs: 3030 cells/uL (ref 850–3900)
MCH: 27.6 pg (ref 27.0–33.0)
MCHC: 31.7 g/dL — ABNORMAL LOW (ref 32.0–36.0)
MCV: 87 fL (ref 80.0–100.0)
MPV: 12.1 fL (ref 7.5–12.5)
Monocytes Relative: 9.2 %
Neutro Abs: 5800 cells/uL (ref 1500–7800)
Neutrophils Relative %: 58 %
Platelets: 410 10*3/uL — ABNORMAL HIGH (ref 140–400)
RBC: 3.7 10*6/uL — ABNORMAL LOW (ref 3.80–5.10)
RDW: 13.4 % (ref 11.0–15.0)
Total Lymphocyte: 30.3 %
WBC: 10 10*3/uL (ref 3.8–10.8)

## 2019-02-22 ENCOUNTER — Other Ambulatory Visit: Payer: Self-pay

## 2019-02-22 ENCOUNTER — Encounter: Payer: Self-pay | Admitting: Family Medicine

## 2019-02-22 ENCOUNTER — Ambulatory Visit (INDEPENDENT_AMBULATORY_CARE_PROVIDER_SITE_OTHER): Payer: Medicare Other | Admitting: Family Medicine

## 2019-02-22 VITALS — BP 120/64 | HR 80 | Temp 96.9°F | Resp 16 | Ht 66.0 in | Wt 212.0 lb

## 2019-02-22 DIAGNOSIS — D696 Thrombocytopenia, unspecified: Secondary | ICD-10-CM | POA: Diagnosis not present

## 2019-02-22 DIAGNOSIS — U071 COVID-19: Secondary | ICD-10-CM

## 2019-02-22 MED ORDER — VALACYCLOVIR HCL 1 G PO TABS: 1000.0000 mg | ORAL_TABLET | Freq: Three times a day (TID) | ORAL | 0 refills | Status: DC

## 2019-02-22 NOTE — Progress Notes (Signed)
Subjective:    Patient ID: Kelli Terrell, female    DOB: August 15, 1950, 69 y.o.   MRN: [id number]  HPI  02/05/19 Patient presents today for "medication reaction".  She started taking meloxicam approximately 2 weeks ago for back pain.  Last week on Friday evening she developed painful blisters in her mouth.  Patient has 2 large blood blisters in her mouth.  She states that over the weekend she also developed frequent episodes of epistaxis without reason.  Her husband would look at her and see blood coming from both nostrils which the patient would not have provoked.  She denies blowing her nose or picking or scratching at her nose.  She is also developed a rash on both legs distal to the knee.  The rash consist of numerous 1 to 2 mm purple nonblanchable spots consistent with petechia.  Starting yesterday she developed copious black tarry stools.  She is also seen some bright red blood per rectum.  On examination she has some small bruises on her back below her left scapula.  None of these are provoked.  This is all occurred over the last 48 hours.  Her hemoglobin at her physical 2 weeks ago was 13.  Her rapid hemoglobin today in the office is 11.7.  We discussed going to the emergency room however the patient is very afraid to go to the emergency room right now due to Covid.  Therefore she would like to obtain a stat CBC to evaluate her platelet count along with her white blood cell count and liver function test.  At that time, my plan was: Patient has petechiae on her legs.  She has had melena.  There has been 1.5 drop in her hemoglobin.  She also has small hemorrhagic bullae in her mouth.  All this is concerning for thrombocytopenia.  She has had one fever yesterday.  Given the fever, the thrombocytopenia I suspect that is present, I am concerned about possible TTP versus acute leukemia.  Medication side effect would be less likely.  Recommended going to the emergency room however the patient is very hesitant  to do this.  Therefore I will send off a stat CMP and CBC.  If the patient has extreme leukocytosis as well as thrombocytopenia, I will recommend going to the emergency room for urgent evaluation.  If the patient sees any more bleeding before I have the labs back later this afternoon she is directed to go immediately to the emergency room.  Spent more than 30 minutes today with the patient trying to explain the findings on her physical exam and my concerns.  Patient was admitted with a platelet count of 1!  Admit date: 02/05/2019 Discharge date: 02/08/2019  Admitted From: Home Disposition:  Home  Recommendations for Outpatient Follow-up:  Follow up with PCP in 2-3 weeks Follow up with primary gastroenterologist  Discharge Condition:Improved CODE STATUS:Full Diet recommendation: Regular as tolerated   Brief/Interim Summary: 69 y.o.femalewith PMH significant for HTN, HLD, GERD, AVM of colon, internal hemorrhoids. Patient was sent to the ED by her primary care provider today for severe thrombocytopenia.  Patient states that she had been taking meloxicam for the last 8 days,the last on Friday, 02/02/19.She noticed 2 dime size raised areas on the left side of her mouth that day.She alsonoticed petechia on her legs, and started having progressive weakness. Patientnoticed black tarry stool last night.  Patient went to her PCPs office.She was noted to have hemorrhagic bullae in her mouth. CBC and CMP  was done. Compared to her normal blood work from 2 weeks ago, herhgb was noted to havedropped down from 13.3 to 10.3, and her platelets have dropped from >300 to 2.  In the ED, patient was afebrile.Breathing comfortably in room air. Blood pressure elevated as high as 159/85.  Discharge Diagnoses:  Principal Problem:   Thrombocytopenia (HCC) Active Problems:   Hypertension   GERD (gastroesophageal reflux disease)   COVID-19 virus infection   Melena  Principal  Problem: Thrombocytopenia in setting of COVID 19 infection- With petechial rash on legs - she presented with platelets of 2- last platelets were 363 on 1/11 - Appreciate input by Hematology -Pt wasstarted on IVIG and given 1 U of pheresed platelets. Platelets increased improved to 282 at time of d/c -No evidence of further acute bleeding, see below  Active Problems: COVID-19 virus infection - she is asymptomatic and this was found to be positive in the hospital - CRPnoted to be 1 -cont Vit C and Zinc -CXR reviewed. Clear  Melena- acute blood loss anemia- h/o GERS - multiple bloody BMs on Sunday and none since - Hbdown to 7.5 from 10.5, received one unit of prbc with follow up hgb of 9.0 and repeat of 8.3 -Pt passed marroon clots on 1/29. No abd pain or nausea -repeat CBC with hgb up to 8.9. Suspecting passage of old blood from when plts were 1 -Pt has been instructed to follow up with her primary gastroenterologist for follow up -Have prescribed BID protonix x 14 days followed by once daily afterwards  02/22/19 Here for follow up.   Appointment on 02/19/2019  Component Date Value Ref Range Status  . WBC 02/19/2019 10.0  3.8 - 10.8 Thousand/uL Final  . RBC 02/19/2019 3.70* 3.80 - 5.10 Million/uL Final  . Hemoglobin 02/19/2019 10.2* 11.7 - 15.5 g/dL Final  . HCT 02/19/2019 32.2* 35.0 - 45.0 % Final  . MCV 02/19/2019 87.0  80.0 - 100.0 fL Final  . MCH 02/19/2019 27.6  27.0 - 33.0 pg Final  . MCHC 02/19/2019 31.7* 32.0 - 36.0 g/dL Final  . RDW 02/19/2019 13.4  11.0 - 15.0 % Final  . Platelets 02/19/2019 410* 140 - 400 Thousand/uL Final  . MPV 02/19/2019 12.1  7.5 - 12.5 fL Final  . Neutro Abs 02/19/2019 5,800  1,500 - 7,800 cells/uL Final  . Lymphs Abs 02/19/2019 3,030  850 - 3,900 cells/uL Final  . Absolute Monocytes 02/19/2019 920  200 - 950 cells/uL Final  . Eosinophils Absolute 02/19/2019 160  15 - 500 cells/uL Final  . Basophils Absolute 02/19/2019 90  0 - 200  cells/uL Final  . Neutrophils Relative % 02/19/2019 58  % Final  . Total Lymphocyte 02/19/2019 30.3  % Final  . Monocytes Relative 02/19/2019 9.2  % Final  . Eosinophils Relative 02/19/2019 1.6  % Final  . Basophils Relative 02/19/2019 0.9  % Final  Overall she is doing well.  She does report some fatigue and some mild dyspnea on exertion however I believe this is in keeping with her drop in hemoglobin.  She was 13 prior to her admission and she is down to 10 now.  She denies any bleeding at present.  She denies any melena or hematochezia.  The petechiae on her legs have resolved.  There are no further episodes of bruising.  The hemorrhagic bullae in her mouth is resolved.  She does have a shingles-like rash on her left flank just above her love handle.  This area is itching  and burning.  It looks like she may be having a shingles outbreak now!.  She states that she is seen the rash for about 3 days.  The rash consists of 5 or 6 2 to 3 mm erythematous papules in a linear dermatomal pattern on her left flank.  She denies any nausea or vomiting or diarrhea.  She denies any pleurisy or hemoptysis.  Past Medical History:  Diagnosis Date  . 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 GERD (gastroesophageal reflux disease)   . Hyperlipidemia   . Hypertension   . Internal hemorrhoids    Past Surgical History:  Procedure Laterality Date  . CHOLECYSTECTOMY    . TONSILLECTOMY     Current Outpatient Medications on File Prior to Visit  Medication Sig Dispense Refill  . amLODipine (NORVASC) 5 MG tablet TAKE 1 TABLET BY MOUTH EVERY DAY (Patient taking differently: Take 5 mg by mouth daily. ) 90 tablet 3  . B Complex CAPS Take by mouth.    . Biotin 10 MG TABS Take 10 mg by mouth daily.    . Cholecalciferol (VITAMIN D) 50 MCG (2000 UT) tablet Take 2,000 Units by mouth daily.    Marland Kitchen gabapentin (NEURONTIN) 300 MG capsule TAKE 2 CAPSULES (600 MG TOTAL) BY MOUTH 3 (THREE) TIMES DAILY.  (Patient taking differently: Take 600 mg by mouth 2 (two) times daily. ) 540 capsule 2  . hydrochlorothiazide (HYDRODIURIL) 25 MG tablet TAKE 1 TABLET BY MOUTH EVERY DAY (Patient taking differently: Take 25 mg by mouth daily. ) 90 tablet 3  . mometasone (ELOCON) 0.1 % cream Apply 1 application topically daily. 45 g 0  . pantoprazole (PROTONIX) 40 MG tablet Take 1 tablet (40 mg total) by mouth 2 (two) times daily for 14 days. 28 tablet 0  . [START ON 02/23/2019] pantoprazole (PROTONIX) 40 MG tablet Take 1 tablet (40 mg total) by mouth daily. 30 tablet 0   No current facility-administered medications on file prior to visit.   Allergies  Allergen Reactions  . Codeine Nausea And Vomiting   Social History   Socioeconomic History  . Marital status: Married    Spouse name: Not on file  . Number of children: Not on file  . Years of education: Not on file  . Highest education level: Not on file  Occupational History  . Not on file  Tobacco Use  . Smoking status: Never Smoker  . Smokeless tobacco: Never Used  Substance and Sexual Activity  . Alcohol use: Yes    Comment: Rare  . Drug use: No  . Sexual activity: Not on file  Other Topics Concern  . Not on file  Social History Narrative   Married. Retired.    Never smoked.    Social Determinants of Health   Financial Resource Strain:   . Difficulty of Paying Living Expenses: Not on file  Food Insecurity:   . Worried About Charity fundraiser in the Last Year: Not on file  . Ran Out of Food in the Last Year: Not on file  Transportation Needs:   . Lack of Transportation (Medical): Not on file  . Lack of Transportation (Non-Medical): Not on file  Physical Activity:   . Days of Exercise per Week: Not on file  . Minutes of Exercise per Session: Not on file  Stress:   . Feeling of Stress : Not on file  Social Connections:   . Frequency of Communication with Friends and Family: Not on file  .  Frequency of Social Gatherings with  Friends and Family: Not on file  . Attends Religious Services: Not on file  . Active Member of Clubs or Organizations: Not on file  . Attends Archivist Meetings: Not on file  . Marital Status: Not on file  Intimate Partner Violence:   . Fear of Current or Ex-Partner: Not on file  . Emotionally Abused: Not on file  . Physically Abused: Not on file  . Sexually Abused: Not on file      Review of Systems  All other systems reviewed and are negative.      Objective:   Physical Exam Constitutional:      General: She is not in acute distress.    Appearance: Normal appearance. She is normal weight. She is not ill-appearing or toxic-appearing.  Cardiovascular:     Rate and Rhythm: Normal rate and regular rhythm.     Heart sounds: Normal heart sounds. No murmur.  Pulmonary:     Effort: Pulmonary effort is normal. No respiratory distress.     Breath sounds: Normal breath sounds. No stridor. No wheezing, rhonchi or rales.  Chest:     Chest wall: No tenderness.  Abdominal:     General: Abdomen is flat. Bowel sounds are normal. There is no distension.     Palpations: Abdomen is soft.     Tenderness: There is no abdominal tenderness. There is no guarding.  Skin:    Findings: Rash present. No petechiae.       Neurological:     Mental Status: She is alert.           Assessment & Plan:  Thrombocytopenia ([place])  COVID-19 virus infection  Patient had ITP either to meloxicam or the COVID-19.  I suspect COVID-19 however I cannot exclude allergic reaction to meloxicam.  Therefore I recommended the patient avoid all NSAIDs from this time forward.  She can use Tylenol for pain or fevers.  I do believe she is having shingles.  I will treat her with Valtrex 1 g p.o. 3 times daily for 7 days.  I recommended that she start a multivitamin with iron and recheck a CBC in 1 month to monitor her hemoglobin and platelet counts.  If the patient experiences any evidence of bleeding or  bruising she should return immediately so that we can check him sooner.  I spent more than 30 minutes today with the patient answering her questions and reviewing her hospital stay.

## 2019-03-02 ENCOUNTER — Inpatient Hospital Stay: Payer: Medicare Other | Admitting: Family Medicine

## 2019-03-05 ENCOUNTER — Other Ambulatory Visit: Payer: Self-pay | Admitting: Family Medicine

## 2019-03-19 ENCOUNTER — Other Ambulatory Visit: Payer: Medicare Other

## 2019-03-19 ENCOUNTER — Other Ambulatory Visit: Payer: Self-pay

## 2019-03-19 DIAGNOSIS — D696 Thrombocytopenia, unspecified: Secondary | ICD-10-CM

## 2019-03-19 LAB — CBC WITH DIFFERENTIAL/PLATELET
Absolute Monocytes: 630 cells/uL (ref 200–950)
Basophils Absolute: 56 cells/uL (ref 0–200)
Basophils Relative: 0.6 %
Eosinophils Absolute: 197 cells/uL (ref 15–500)
Eosinophils Relative: 2.1 %
HCT: 35.4 % (ref 35.0–45.0)
Hemoglobin: 11.2 g/dL — ABNORMAL LOW (ref 11.7–15.5)
Lymphs Abs: 3469 cells/uL (ref 850–3900)
MCH: 27.9 pg (ref 27.0–33.0)
MCHC: 31.6 g/dL — ABNORMAL LOW (ref 32.0–36.0)
MCV: 88.1 fL (ref 80.0–100.0)
MPV: 12.2 fL (ref 7.5–12.5)
Monocytes Relative: 6.7 %
Neutro Abs: 5048 cells/uL (ref 1500–7800)
Neutrophils Relative %: 53.7 %
Platelets: 334 10*3/uL (ref 140–400)
RBC: 4.02 10*6/uL (ref 3.80–5.10)
RDW: 13.9 % (ref 11.0–15.0)
Total Lymphocyte: 36.9 %
WBC: 9.4 10*3/uL (ref 3.8–10.8)

## 2019-04-08 ENCOUNTER — Other Ambulatory Visit: Payer: Self-pay | Admitting: Family Medicine

## 2019-05-07 DIAGNOSIS — L821 Other seborrheic keratosis: Secondary | ICD-10-CM | POA: Diagnosis not present

## 2019-05-07 DIAGNOSIS — Z85828 Personal history of other malignant neoplasm of skin: Secondary | ICD-10-CM | POA: Diagnosis not present

## 2019-05-07 DIAGNOSIS — D1801 Hemangioma of skin and subcutaneous tissue: Secondary | ICD-10-CM | POA: Diagnosis not present

## 2019-05-07 DIAGNOSIS — L570 Actinic keratosis: Secondary | ICD-10-CM | POA: Diagnosis not present

## 2019-05-07 DIAGNOSIS — L814 Other melanin hyperpigmentation: Secondary | ICD-10-CM | POA: Diagnosis not present

## 2019-05-07 DIAGNOSIS — L738 Other specified follicular disorders: Secondary | ICD-10-CM | POA: Diagnosis not present

## 2019-05-10 ENCOUNTER — Ambulatory Visit: Payer: Medicare Other | Attending: Internal Medicine

## 2019-05-10 DIAGNOSIS — Z23 Encounter for immunization: Secondary | ICD-10-CM

## 2019-05-10 NOTE — Progress Notes (Signed)
   Covid-19 Vaccination Clinic  Name:  KEMILY HANAWALT    MRN: [id number] DOB: [birth year]  05/10/2019  Ms. Fantin was observed post Covid-19 immunization for 15 minutes without incident. She was provided with Vaccine Information Sheet and instruction to access the V-Safe system.   Ms. Oguinn was instructed to call 911 with any severe reactions post vaccine: Marland Kitchen Difficulty breathing  . Swelling of face and throat  . A fast heartbeat  . A bad rash all over body  . Dizziness and weakness   Immunizations Administered    Name Date Dose VIS Date Route   Moderna COVID-19 Vaccine 05/10/2019  8:25 AM 0.5 mL 12/2018 Intramuscular   Manufacturer: Moderna   Lot: [id number]   [id number]

## 2019-06-06 DIAGNOSIS — H353132 Nonexudative age-related macular degeneration, bilateral, intermediate dry stage: Secondary | ICD-10-CM | POA: Diagnosis not present

## 2019-06-07 ENCOUNTER — Ambulatory Visit: Payer: Medicare Other | Attending: Internal Medicine

## 2019-06-07 ENCOUNTER — Other Ambulatory Visit: Payer: Self-pay

## 2019-06-07 DIAGNOSIS — Z23 Encounter for immunization: Secondary | ICD-10-CM

## 2019-06-07 NOTE — Progress Notes (Signed)
   Covid-19 Vaccination Clinic  Name:  Kelli Terrell    MRN: [id number] DOB: [birth year]  06/07/2019  [name] was observed post Covid-19 immunization for 15 minutes without incident. She was provided with Vaccine Information Sheet and instruction to access the V-Safe system.   [name] was instructed to call 911 with any severe reactions post vaccine: Marland Kitchen Difficulty breathing  . Swelling of face and throat  . A fast heartbeat  . A bad rash all over body  . Dizziness and weakness   Immunizations Administered    Name Date Dose VIS Date Route   Moderna COVID-19 Vaccine 06/07/2019  9:06 AM 0.5 mL 12/2018 Intramuscular   Manufacturer: Moderna   Lot: [id number]   [id number]

## 2019-06-19 ENCOUNTER — Other Ambulatory Visit: Payer: Self-pay | Admitting: Family Medicine

## 2019-07-03 ENCOUNTER — Other Ambulatory Visit: Payer: Self-pay | Admitting: Family Medicine

## 2019-07-03 MED ORDER — PANTOPRAZOLE SODIUM 40 MG PO TBEC: 40.0000 mg | DELAYED_RELEASE_TABLET | Freq: Every day | ORAL | 1 refills | Status: DC

## 2019-08-13 DIAGNOSIS — Z1231 Encounter for screening mammogram for malignant neoplasm of breast: Secondary | ICD-10-CM | POA: Diagnosis not present

## 2019-09-20 ENCOUNTER — Other Ambulatory Visit: Payer: Self-pay | Admitting: Family Medicine

## 2019-09-24 ENCOUNTER — Other Ambulatory Visit: Payer: Self-pay

## 2019-09-24 ENCOUNTER — Other Ambulatory Visit: Payer: Medicare Other

## 2019-09-24 DIAGNOSIS — D696 Thrombocytopenia, unspecified: Secondary | ICD-10-CM

## 2019-09-24 LAB — CBC WITH DIFFERENTIAL/PLATELET
Absolute Monocytes: 689 cells/uL (ref 200–950)
Basophils Absolute: 84 cells/uL (ref 0–200)
Basophils Relative: 1 %
Eosinophils Absolute: 252 cells/uL (ref 15–500)
Eosinophils Relative: 3 %
HCT: 40.7 % (ref 35.0–45.0)
Hemoglobin: 13.1 g/dL (ref 11.7–15.5)
Lymphs Abs: 2864 cells/uL (ref 850–3900)
MCH: 29.2 pg (ref 27.0–33.0)
MCHC: 32.2 g/dL (ref 32.0–36.0)
MCV: 90.8 fL (ref 80.0–100.0)
MPV: 12.3 fL (ref 7.5–12.5)
Monocytes Relative: 8.2 %
Neutro Abs: 4511 cells/uL (ref 1500–7800)
Neutrophils Relative %: 53.7 %
Platelets: 257 10*3/uL (ref 140–400)
RBC: 4.48 10*6/uL (ref 3.80–5.10)
RDW: 12.2 % (ref 11.0–15.0)
Total Lymphocyte: 34.1 %
WBC: 8.4 10*3/uL (ref 3.8–10.8)

## 2019-10-04 ENCOUNTER — Other Ambulatory Visit: Payer: Self-pay

## 2019-10-04 ENCOUNTER — Ambulatory Visit (INDEPENDENT_AMBULATORY_CARE_PROVIDER_SITE_OTHER): Payer: Medicare Other

## 2019-10-04 DIAGNOSIS — Z23 Encounter for immunization: Secondary | ICD-10-CM | POA: Diagnosis not present

## 2019-10-04 MED ORDER — OMEPRAZOLE 40 MG PO CPDR: 40.0000 mg | DELAYED_RELEASE_CAPSULE | Freq: Every day | ORAL | 3 refills | Status: DC

## 2019-11-22 DIAGNOSIS — H353132 Nonexudative age-related macular degeneration, bilateral, intermediate dry stage: Secondary | ICD-10-CM | POA: Diagnosis not present

## 2019-12-25 ENCOUNTER — Other Ambulatory Visit: Payer: Self-pay | Admitting: Family Medicine

## 2019-12-28 ENCOUNTER — Other Ambulatory Visit: Payer: Self-pay | Admitting: Family Medicine

## 2020-01-29 ENCOUNTER — Encounter: Payer: Medicare Other | Admitting: Family Medicine

## 2020-02-12 DIAGNOSIS — J029 Acute pharyngitis, unspecified: Secondary | ICD-10-CM | POA: Diagnosis not present

## 2020-03-04 ENCOUNTER — Other Ambulatory Visit: Payer: Self-pay

## 2020-03-04 ENCOUNTER — Other Ambulatory Visit: Payer: Medicare Other

## 2020-03-04 DIAGNOSIS — E7849 Other hyperlipidemia: Secondary | ICD-10-CM

## 2020-03-04 DIAGNOSIS — Z1159 Encounter for screening for other viral diseases: Secondary | ICD-10-CM

## 2020-03-04 DIAGNOSIS — I10 Essential (primary) hypertension: Secondary | ICD-10-CM

## 2020-03-04 DIAGNOSIS — E559 Vitamin D deficiency, unspecified: Secondary | ICD-10-CM | POA: Diagnosis not present

## 2020-03-04 DIAGNOSIS — Z13220 Encounter for screening for lipoid disorders: Secondary | ICD-10-CM

## 2020-03-04 DIAGNOSIS — Z136 Encounter for screening for cardiovascular disorders: Secondary | ICD-10-CM | POA: Diagnosis not present

## 2020-03-05 LAB — CBC WITH DIFFERENTIAL/PLATELET
Absolute Monocytes: 540 cells/uL (ref 200–950)
Basophils Absolute: 58 cells/uL (ref 0–200)
Basophils Relative: 0.8 %
Eosinophils Absolute: 202 cells/uL (ref 15–500)
Eosinophils Relative: 2.8 %
HCT: 39.7 % (ref 35.0–45.0)
Hemoglobin: 13.1 g/dL (ref 11.7–15.5)
Lymphs Abs: 2700 cells/uL (ref 850–3900)
MCH: 29.6 pg (ref 27.0–33.0)
MCHC: 33 g/dL (ref 32.0–36.0)
MCV: 89.6 fL (ref 80.0–100.0)
MPV: 12.6 fL — ABNORMAL HIGH (ref 7.5–12.5)
Monocytes Relative: 7.5 %
Neutro Abs: 3701 cells/uL (ref 1500–7800)
Neutrophils Relative %: 51.4 %
Platelets: 245 10*3/uL (ref 140–400)
RBC: 4.43 10*6/uL (ref 3.80–5.10)
RDW: 12.2 % (ref 11.0–15.0)
Total Lymphocyte: 37.5 %
WBC: 7.2 10*3/uL (ref 3.8–10.8)

## 2020-03-05 LAB — COMPLETE METABOLIC PANEL WITH GFR
AG Ratio: 1.4 (calc) (ref 1.0–2.5)
ALT: 19 U/L (ref 6–29)
AST: 20 U/L (ref 10–35)
Albumin: 4 g/dL (ref 3.6–5.1)
Alkaline phosphatase (APISO): 77 U/L (ref 37–153)
BUN: 15 mg/dL (ref 7–25)
CO2: 21 mmol/L (ref 20–32)
Calcium: 10.5 mg/dL — ABNORMAL HIGH (ref 8.6–10.4)
Chloride: 103 mmol/L (ref 98–110)
Creat: 0.75 mg/dL (ref 0.60–0.93)
GFR, Est African American: 92 mL/min/{1.73_m2} (ref >=60)
GFR, Est Non African American: 80 mL/min/{1.73_m2} (ref >=60)
Globulin: 2.9 g/dL (calc) (ref 1.9–3.7)
Glucose, Bld: 96 mg/dL (ref 65–99)
Potassium: 4.1 mmol/L (ref 3.5–5.3)
Sodium: 139 mmol/L (ref 135–146)
Total Bilirubin: 0.4 mg/dL (ref 0.2–1.2)
Total Protein: 6.9 g/dL (ref 6.1–8.1)

## 2020-03-05 LAB — LIPID PANEL
Cholesterol: 216 mg/dL — ABNORMAL HIGH (ref <200)
HDL: 55 mg/dL (ref >=50)
LDL Cholesterol (Calc): 138 mg/dL (calc) — ABNORMAL HIGH
Non-HDL Cholesterol (Calc): 161 mg/dL (calc) — ABNORMAL HIGH (ref <130)
Total CHOL/HDL Ratio: 3.9 (calc) (ref <5.0)
Triglycerides: 110 mg/dL (ref <150)

## 2020-03-05 LAB — HEPATITIS C ANTIBODY
Hepatitis C Ab: NONREACTIVE
SIGNAL TO CUT-OFF: 0.05 (ref <1.00)

## 2020-03-05 LAB — VITAMIN D 25 HYDROXY (VIT D DEFICIENCY, FRACTURES): Vit D, 25-Hydroxy: 43 ng/mL (ref 30–100)

## 2020-03-11 ENCOUNTER — Other Ambulatory Visit: Payer: Self-pay

## 2020-03-11 ENCOUNTER — Ambulatory Visit (INDEPENDENT_AMBULATORY_CARE_PROVIDER_SITE_OTHER): Payer: Medicare Other | Admitting: Family Medicine

## 2020-03-11 ENCOUNTER — Encounter: Payer: Self-pay | Admitting: Family Medicine

## 2020-03-11 VITALS — BP 124/80 | HR 74 | Temp 98.4°F | Resp 18 | Ht 66.0 in | Wt 212.0 lb

## 2020-03-11 DIAGNOSIS — Z0000 Encounter for general adult medical examination without abnormal findings: Secondary | ICD-10-CM

## 2020-03-11 DIAGNOSIS — Z0001 Encounter for general adult medical examination with abnormal findings: Secondary | ICD-10-CM

## 2020-03-11 DIAGNOSIS — Z780 Asymptomatic menopausal state: Secondary | ICD-10-CM | POA: Diagnosis not present

## 2020-03-11 DIAGNOSIS — E7849 Other hyperlipidemia: Secondary | ICD-10-CM | POA: Diagnosis not present

## 2020-03-11 DIAGNOSIS — I10 Essential (primary) hypertension: Secondary | ICD-10-CM | POA: Diagnosis not present

## 2020-03-11 NOTE — Progress Notes (Signed)
Subjective:    Patient ID: Kelli Terrell, female    DOB: 12-07-1950, 70 y.o.   MRN: [id number]  HPI Here for CPE.  Her last colonoscopy was in 2018.  It was clear.  Her gastroenterologist recommended a repeat colonoscopy in 5 years however the patient states that she has not had colon polyps and no family history of colon cancer so I am not sure if 5 years is necessary.  She would next be due in 2023 and we decided we would discuss it at that time if she wanted to go for the colonoscopy then her weight for 7 years.  Her mammogram was done in August and was normal.  Is been more than 5 years since her last bone density test so she is due for this.  Her immunizations are up-to-date except for Shingrix which I encouraged her to get today.  She has not had a booster on her COVID vaccine however she recently had omicron so in effect she had natural immunity from having fought off the virus.  She is waiting for 90 days prior to getting her booster given the fact that she just had it.  She denies any falls, depression, or memory loss Immunization History  Administered Date(s) Administered  . Fluad Quad(high Dose 65+) 09/27/2018, 10/04/2019  . Influenza, High Dose Seasonal PF 11/30/2017  . Influenza,inj,Quad PF,6+ Mos 11/23/2012, 12/20/2013, 11/01/2014, 01/08/2016, 10/21/2016  . Influenza-Unspecified 12/21/2010, 10/04/2011  . Moderna Sars-Covid-2 Vaccination 05/10/2019, 06/07/2019  . Pneumococcal Conjugate-13 12/27/2014  . Pneumococcal Polysaccharide-23 12/20/2013  . Tdap 12/14/2012    Lab on 03/04/2020  Component Date Value Ref Range Status  . WBC 03/04/2020 7.2  3.8 - 10.8 Thousand/uL Final  . RBC 03/04/2020 4.43  3.80 - 5.10 Million/uL Final  . Hemoglobin 03/04/2020 13.1  11.7 - 15.5 g/dL Final  . HCT 03/04/2020 39.7  35.0 - 45.0 % Final  . MCV 03/04/2020 89.6  80.0 - 100.0 fL Final  . MCH 03/04/2020 29.6  27.0 - 33.0 pg Final  . MCHC 03/04/2020 33.0  32.0 - 36.0 g/dL Final  . RDW 03/04/2020  12.2  11.0 - 15.0 % Final  . Platelets 03/04/2020 245  140 - 400 Thousand/uL Final  . MPV 03/04/2020 12.6* 7.5 - 12.5 fL Final  . Neutro Abs 03/04/2020 3,701  1,500 - 7,800 cells/uL Final  . Lymphs Abs 03/04/2020 2,700  850 - 3,900 cells/uL Final  . Absolute Monocytes 03/04/2020 540  200 - 950 cells/uL Final  . Eosinophils Absolute 03/04/2020 202  15 - 500 cells/uL Final  . Basophils Absolute 03/04/2020 58  0 - 200 cells/uL Final  . Neutrophils Relative % 03/04/2020 51.4  % Final  . Total Lymphocyte 03/04/2020 37.5  % Final  . Monocytes Relative 03/04/2020 7.5  % Final  . Eosinophils Relative 03/04/2020 2.8  % Final  . Basophils Relative 03/04/2020 0.8  % Final  . Glucose, Bld 03/04/2020 96  65 - 99 mg/dL Final   Comment: .            Fasting reference interval .   . BUN 03/04/2020 15  7 - 25 mg/dL Final  . Creat 03/04/2020 0.75  0.60 - 0.93 mg/dL Final   Comment: For patients >46 years of age, the reference limit for Creatinine is approximately 13% higher for people identified as African-American. .   . GFR, Est Non African American 03/04/2020 80  > OR = 60 mL/min/1.35m2 Final  . GFR, Est African American 03/04/2020 92  >  OR = 60 mL/min/1.75m2 Final  . BUN/Creatinine Ratio 59/16/3846 NOT APPLICABLE  6 - 22 (calc) Final  . Sodium 03/04/2020 139  135 - 146 mmol/L Final  . Potassium 03/04/2020 4.1  3.5 - 5.3 mmol/L Final  . Chloride 03/04/2020 103  98 - 110 mmol/L Final  . CO2 03/04/2020 21  20 - 32 mmol/L Final  . Calcium 03/04/2020 10.5* 8.6 - 10.4 mg/dL Final  . Total Protein 03/04/2020 6.9  6.1 - 8.1 g/dL Final  . Albumin 03/04/2020 4.0  3.6 - 5.1 g/dL Final  . Globulin 03/04/2020 2.9  1.9 - 3.7 g/dL (calc) Final  . AG Ratio 03/04/2020 1.4  1.0 - 2.5 (calc) Final  . Total Bilirubin 03/04/2020 0.4  0.2 - 1.2 mg/dL Final  . Alkaline phosphatase (APISO) 03/04/2020 77  37 - 153 U/L Final  . AST 03/04/2020 20  10 - 35 U/L Final  . ALT 03/04/2020 19  6 - 29 U/L Final  . Hepatitis  C Ab 03/04/2020 NON-REACTIVE  NON-REACTI Final  . SIGNAL TO CUT-OFF 03/04/2020 0.05  <1.00 Final   Comment: . HCV antibody was non-reactive. There is no laboratory  evidence of HCV infection. . In most cases, no further action is required. However, if recent HCV exposure is suspected, a test for HCV RNA (test code [phone]) is suggested. . For additional information please refer to http://education.[facility].com/faq/FAQ22v1 (This link is being provided for informational/ educational purposes only.) .   Marland Kitchen Cholesterol 03/04/2020 216* <200 mg/dL Final  . HDL 03/04/2020 55  > OR = 50 mg/dL Final  . Triglycerides 03/04/2020 110  <150 mg/dL Final  . LDL Cholesterol (Calc) 03/04/2020 138* mg/dL (calc) Final   Comment: Reference range: <100 . Desirable range <100 mg/dL for primary prevention;   <70 mg/dL for patients with CHD or diabetic patients  with > or = 2 CHD risk factors. Marland Kitchen LDL-C is now calculated using the Martin-Hopkins  calculation, which is a validated novel method providing  better accuracy than the Friedewald equation in the  estimation of LDL-C.  Cresenciano Genre et al. Annamaria Helling. 5701;779(39): 2061-2068  (http://education.[facility].com/faq/FAQ164)   . Total CHOL/HDL Ratio 03/04/2020 3.9  <5.0 (calc) Final  . Non-HDL Cholesterol (Calc) 03/04/2020 161* <130 mg/dL (calc) Final   Comment: For patients with diabetes plus 1 major ASCVD risk  factor, treating to a non-HDL-C goal of <100 mg/dL  (LDL-C of <70 mg/dL) is considered a therapeutic  option.   . Vit D, 25-Hydroxy 03/04/2020 43  30 - 100 ng/mL Final   Comment: Vitamin D Status         25-OH Vitamin D: . Deficiency:                    <20 ng/mL Insufficiency:             20 - 29 ng/mL Optimal:                 > or = 30 ng/mL . For 25-OH Vitamin D testing on patients on  D2-supplementation and patients for whom quantitation  of D2 and D3 fractions is required, the [facility]ure[facility](TM) 25-OH VIT D, (D2,D3), LC/MS/MS  is recommended: order  code [phone] (patients >21yrs). See Note 1 . Note 1 . For additional information, please refer to  http://education.[facility].com/faq/FAQ199  (This link is being provided for informational/ educational purposes only.)     Past Medical History:  Diagnosis Date  . 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  GERD (gastroesophageal reflux disease)   . Hyperlipidemia   . Hypertension   . Internal hemorrhoids    Past Surgical History:  Procedure Laterality Date  . CHOLECYSTECTOMY    . TONSILLECTOMY     Current Outpatient Medications on File Prior to Visit  Medication Sig Dispense Refill  . amLODipine (NORVASC) 5 MG tablet TAKE 1 TABLET BY MOUTH EVERY DAY 90 tablet 3  . B Complex CAPS Take by mouth.    . Biotin 10 MG TABS Take 10 mg by mouth daily.    . Cholecalciferol (VITAMIN D) 50 MCG (2000 UT) tablet Take 2,000 Units by mouth daily.    . furosemide (LASIX) 40 MG tablet TAKE 1 TABLET BY MOUTH EVERY DAY 90 tablet 2  . gabapentin (NEURONTIN) 300 MG capsule TAKE 2 CAPSULES (600 MG TOTAL) BY MOUTH 2 (TWO) TIMES DAILY. 360 capsule 0  . hydrochlorothiazide (HYDRODIURIL) 25 MG tablet TAKE 1 TABLET BY MOUTH EVERY DAY 90 tablet 3  . mometasone (ELOCON) 0.1 % cream Apply 1 application topically daily. 45 g 0  . omeprazole (PRILOSEC) 40 MG capsule TAKE 1 CAPSULE BY MOUTH EVERY DAY 90 capsule 1  . pantoprazole (PROTONIX) 40 MG tablet Take 1 tablet (40 mg total) by mouth daily. 90 tablet 1  . valACYclovir (VALTREX) 1000 MG tablet Take 1 tablet (1,000 mg total) by mouth 3 (three) times daily. 21 tablet 0   No current facility-administered medications on file prior to visit.   Allergies  Allergen Reactions  . Mobic [Meloxicam]     ITP with platelet count of 1!  Had COVID-19 at the same time.    . Codeine Nausea And Vomiting   Social History   Socioeconomic History  . Marital status: Married    Spouse name: Not on file  . Number of  children: Not on file  . Years of education: Not on file  . Highest education level: Not on file  Occupational History  . Not on file  Tobacco Use  . Smoking status: Never Smoker  . Smokeless tobacco: Never Used  Substance and Sexual Activity  . Alcohol use: Yes    Comment: Rare  . Drug use: No  . Sexual activity: Not on file  Other Topics Concern  . Not on file  Social History Narrative   Married. Retired.    Never smoked.    Social Determinants of Health   Financial Resource Strain: Not on file  Food Insecurity: Not on file  Transportation Needs: Not on file  Physical Activity: Not on file  Stress: Not on file  Social Connections: Not on file  Intimate Partner Violence: Not on file   Family History  Problem Relation Age of Onset  . Cancer Father        kidney  . Stroke Brother   . Cancer Brother        Thyroid Cancer  . Heart disease Brother      Review of Systems  All other systems reviewed and are negative.      Objective:   Physical Exam Vitals reviewed.  Constitutional:      General: She is not in acute distress.    Appearance: She is well-developed. She is not diaphoretic.  HENT:     Head: Normocephalic and atraumatic.     Right Ear: External ear normal.     Left Ear: External ear normal.     Nose: Nose normal.     Mouth/Throat:     Pharynx: No oropharyngeal  exudate.  Eyes:     General: No scleral icterus.       Right eye: No discharge.        Left eye: No discharge.     Conjunctiva/sclera: Conjunctivae normal.     Pupils: Pupils are equal, round, and reactive to light.  Neck:     Thyroid: No thyromegaly.     Vascular: No JVD.     Trachea: No tracheal deviation.  Cardiovascular:     Rate and Rhythm: Normal rate and regular rhythm.     Heart sounds: Normal heart sounds. No murmur heard. No friction rub. No gallop.   Pulmonary:     Effort: Pulmonary effort is normal. No respiratory distress.     Breath sounds: Normal breath sounds. No  stridor. No wheezing or rales.  Chest:     Chest wall: No tenderness.  Abdominal:     General: Bowel sounds are normal. There is no distension.     Palpations: Abdomen is soft. There is no mass.     Tenderness: There is no abdominal tenderness. There is no guarding or rebound.  Musculoskeletal:        General: No tenderness. Normal range of motion.     Cervical back: Normal range of motion and neck supple.  Lymphadenopathy:     Cervical: No cervical adenopathy.  Skin:    General: Skin is warm.     Coloration: Skin is not pale.     Findings: No erythema or rash.  Neurological:     Mental Status: She is alert and oriented to person, place, and time.     Cranial Nerves: No cranial nerve deficit.     Motor: No abnormal muscle tone.     Coordination: Coordination normal.     Deep Tendon Reflexes: Reflexes are normal and symmetric.  Psychiatric:        Behavior: Behavior normal.        Thought Content: Thought content normal.        Judgment: Judgment normal.           Assessment & Plan:  Routine general medical examination at a health care facility  Essential hypertension  Other hyperlipidemia  Postmenopausal estrogen deficiency - Plan: DG Bone Density  Recommended the shingles vaccine.  Mammogram is due in August.  Colonoscopy is up-to-date.  Due to her age she does not require a Pap smear.  Schedule the patient for a bone density test.  Blood pressure today is acceptable.  Lab work is significant only for hyperlipidemia.  We discussed her options and she elects to try fish oil 2000 mg daily.  She continues to complain of neuropathic pain in both legs and both feet at night however she does not want to take more gabapentin due to potential weight gain from water retention and also because her hair is thinning and she is done research saying the gabapentin may cause hair to thin.  We discussed trying amitriptyline but at the present time she declines.  She denies any falls,  memory loss, or depression

## 2020-03-27 ENCOUNTER — Other Ambulatory Visit: Payer: Self-pay | Admitting: Family Medicine

## 2020-04-22 ENCOUNTER — Telehealth: Payer: Self-pay | Admitting: Family Medicine

## 2020-04-22 NOTE — Progress Notes (Signed)
  Chronic Care Management   Note  04/22/2020 Name: Kelli Terrell MRN: [id number] DOB: 1950-06-15  Kelli Terrell is a 70 y.o. year old female who is a primary care patient of Pickard, Cammie Mcgee, MD. I reached out to Pamella Pert by phone today in response to a referral sent by Ms. Odessie P Dillingham's PCP, Susy Frizzle, MD.   Ms. Sheller was given information about Chronic Care Management services today including:  1. CCM service includes personalized support from designated clinical staff supervised by her physician, including individualized plan of care and coordination with other care providers 2. 24/7 contact phone numbers for assistance for urgent and routine care needs. 3. Service will only be billed when office clinical staff spend 20 minutes or more in a month to coordinate care. 4. Only one practitioner may furnish and bill the service in a calendar month. 5. The patient may stop CCM services at any time (effective at the end of the month) by phone call to the office staff.   Patient agreed to services and verbal consent obtained.   Follow up plan:   Carley Perdue [employer] Scheduler

## 2020-04-22 NOTE — Progress Notes (Signed)
  Chronic Care Management   Note  04/22/2020 Name: [name] MRN: [id number] DOB: [birth year]  Kelli Terrell is a 70 y.o. year old female who is a primary care patient of Pickard, Cammie Mcgee, MD. I reached out to Pamella Pert by phone today in response to a referral sent by Ms. Jaylee P Broad's PCP, Susy Frizzle, MD.   Kelli Terrell was given information about Chronic Care Management services today including:  1. CCM service includes personalized support from designated clinical staff supervised by her physician, including individualized plan of care and coordination with other care providers 2. 24/7 contact phone numbers for assistance for urgent and routine care needs. 3. Service will only be billed when office clinical staff spend 20 minutes or more in a month to coordinate care. 4. Only one practitioner may furnish and bill the service in a calendar month. 5. The patient may stop CCM services at any time (effective at the end of the month) by phone call to the office staff.   Patient agreed to services and verbal consent obtained.   Follow up plan:   Carley Perdue [employer] Scheduler

## 2020-04-22 NOTE — Progress Notes (Signed)
  Chronic Care Management   Outreach Note  04/22/2020 Name: Kelli Terrell MRN: [id number] DOB: 09/15/1950  Referred by: Susy Frizzle, MD Reason for referral : No chief complaint on file.   An unsuccessful telephone outreach was attempted today. The patient was referred to the pharmacist for assistance with care management and care coordination.   Follow Up Plan:   Carley Perdue [employer] Scheduler

## 2020-05-15 IMAGING — DX DG CHEST 1V PORT
1 series · 1 of 1 positions shown · non-contrast
Comparison: October 04, 2011

CLINICAL DATA: Black, tarry stools.

EXAM:
PORTABLE CHEST 1 VIEW

[chest ap]
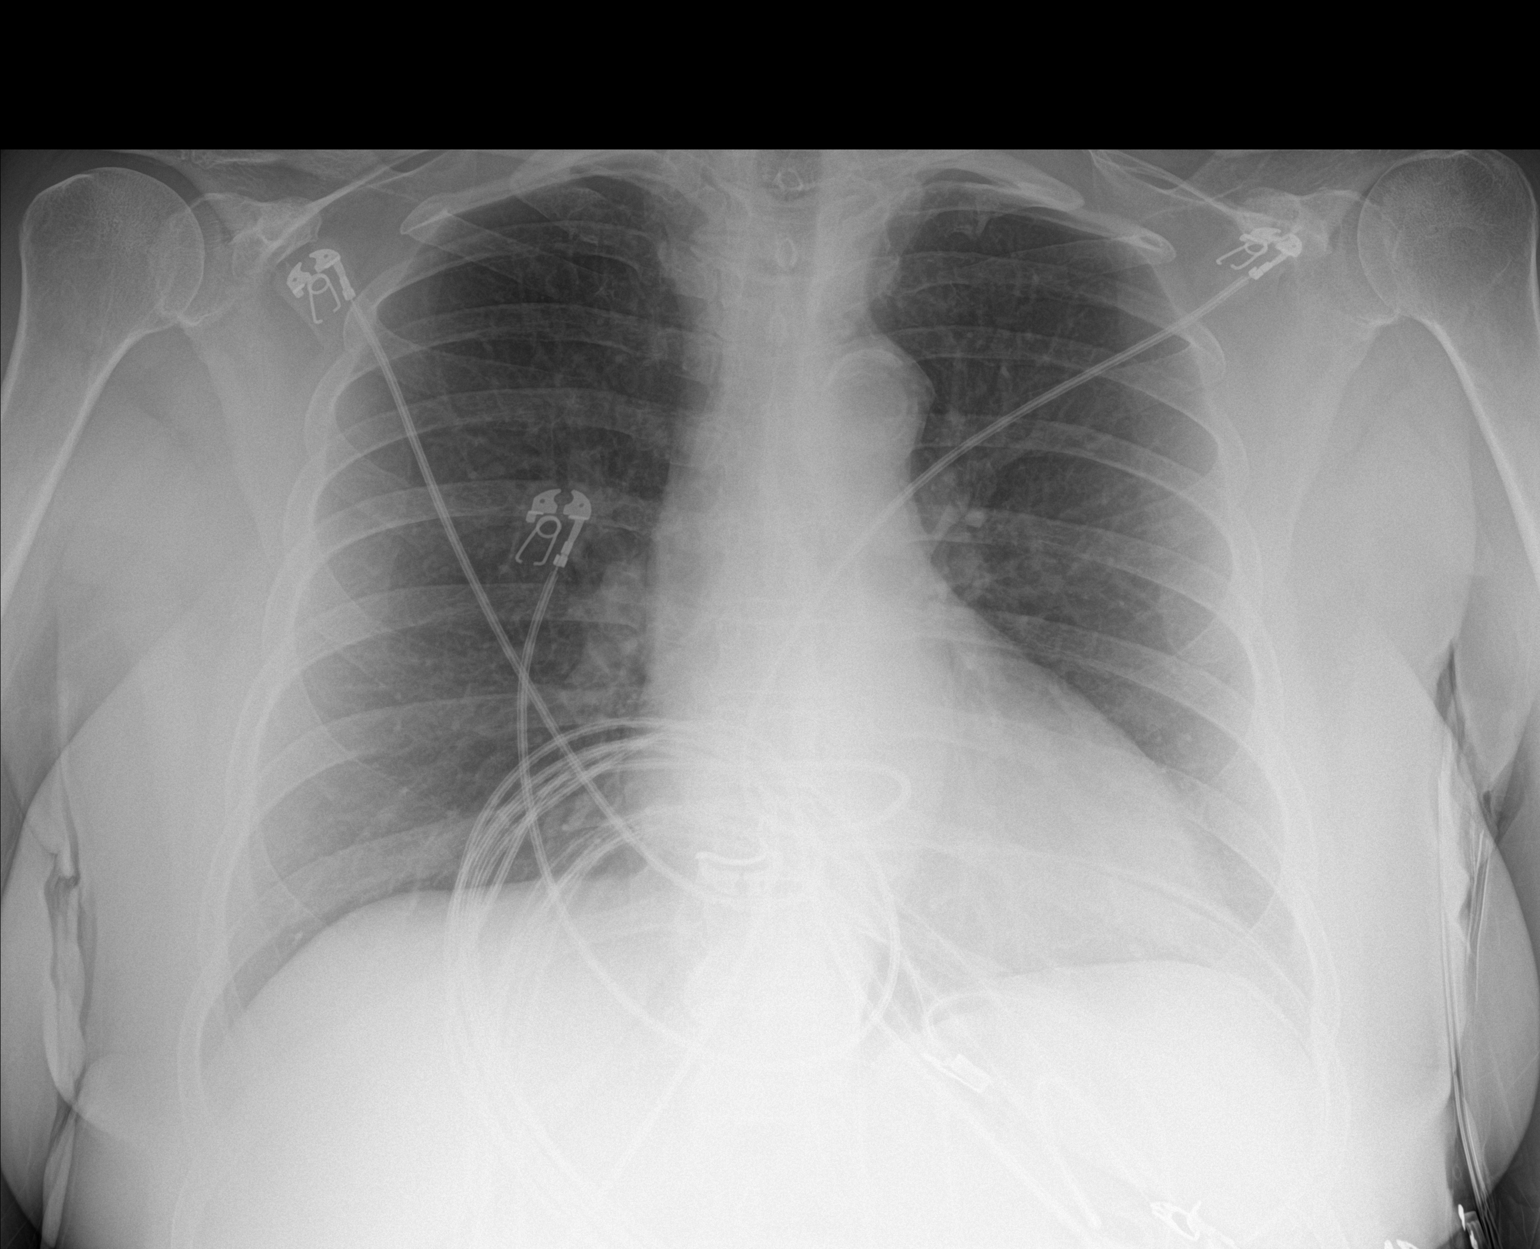

[1 of 1 positions shown; findings below may reference images not displayed]

FINDINGS: The heart size and mediastinal contours are within normal limits.
Both lungs are clear. There is moderate severity calcification of
the aortic arch. The visualized skeletal structures are
unremarkable.
IMPRESSION: No active disease.

## 2020-05-30 ENCOUNTER — Ambulatory Visit: Payer: Medicare Other

## 2020-06-25 ENCOUNTER — Other Ambulatory Visit: Payer: Self-pay | Admitting: *Deleted

## 2020-06-25 MED ORDER — OMEPRAZOLE 40 MG PO CPDR: DELAYED_RELEASE_CAPSULE | ORAL | 1 refills | Status: DC

## 2020-07-28 ENCOUNTER — Other Ambulatory Visit: Payer: Self-pay

## 2020-07-28 ENCOUNTER — Encounter: Payer: Self-pay | Admitting: Family Medicine

## 2020-07-28 ENCOUNTER — Ambulatory Visit (INDEPENDENT_AMBULATORY_CARE_PROVIDER_SITE_OTHER): Payer: Medicare Other | Admitting: Family Medicine

## 2020-07-28 VITALS — BP 126/64 | HR 72 | Temp 97.9°F | Resp 14 | Ht 66.0 in | Wt 209.0 lb

## 2020-07-28 DIAGNOSIS — H9202 Otalgia, left ear: Secondary | ICD-10-CM

## 2020-07-28 MED ORDER — NEOMYCIN-POLYMYXIN-HC 3.5-10000-1 OT SOLN: 3.0000 [drp] | Freq: Four times a day (QID) | OTIC | 0 refills | Status: DC

## 2020-07-28 NOTE — Progress Notes (Signed)
Subjective:    Patient ID: Kelli Terrell, female    DOB: [birth year], 70 y.o.   MRN: [id number]  HPI  Episodic sharp Left ear pain.  Occurs every month or so.  Last a day or two and resolves.  Sharp shooting pain that radiates behind the ear from the neck area.  Electrical in nature.  No hearing loss or dizziness.  Past Medical History:  Diagnosis Date   AVM (arteriovenous malformation) of colon without hemorrhage    GERD (gastroesophageal reflux disease)    Hyperlipidemia    Hypertension    Internal hemorrhoids    Past Surgical History:  Procedure Laterality Date   CHOLECYSTECTOMY     TONSILLECTOMY     Current Outpatient Medications on File Prior to Visit  Medication Sig Dispense Refill   amLODipine (NORVASC) 5 MG tablet TAKE 1 TABLET BY MOUTH EVERY DAY 90 tablet 3   B Complex CAPS Take by mouth.     Biotin 10 MG TABS Take 10 mg by mouth daily.     Cholecalciferol (VITAMIN D) 50 MCG (2000 UT) tablet Take 2,000 Units by mouth daily.     furosemide (LASIX) 40 MG tablet TAKE 1 TABLET BY MOUTH EVERY DAY 90 tablet 2   gabapentin (NEURONTIN) 300 MG capsule TAKE 2 CAPSULES (600 MG TOTAL) BY MOUTH 2 (TWO) TIMES DAILY. 360 capsule 0   hydrochlorothiazide (HYDRODIURIL) 25 MG tablet TAKE 1 TABLET BY MOUTH EVERY DAY 90 tablet 3   mometasone (ELOCON) 0.1 % cream Apply 1 application topically daily. (Patient not taking: Reported on 03/11/2020) 45 g 0   omeprazole (PRILOSEC) 40 MG capsule TAKE 1 CAPSULE BY MOUTH EVERY DAY 90 capsule 1   pantoprazole (PROTONIX) 40 MG tablet Take 1 tablet (40 mg total) by mouth daily. (Patient not taking: Reported on 03/11/2020) 90 tablet 1   valACYclovir (VALTREX) 1000 MG tablet Take 1 tablet (1,000 mg total) by mouth 3 (three) times daily. (Patient not taking: Reported on 03/11/2020) 21 tablet 0   No current facility-administered medications on file prior to visit.   Allergies  Allergen Reactions   Mobic [Meloxicam]     ITP with platelet count of 1!  Had COVID-19 at  the same time.     Codeine Nausea And Vomiting   Social History   Socioeconomic History   Marital status: Married    Spouse name: Not on file   Number of children: Not on file   Years of education: Not on file   Highest education level: Not on file  Occupational History   Not on file  Tobacco Use   Smoking status: Never   Smokeless tobacco: Never  Substance and Sexual Activity   Alcohol use: Yes    Comment: Rare   Drug use: No   Sexual activity: Not on file  Other Topics Concern   Not on file  Social History Narrative   Married. Retired.    Never smoked.    Social Determinants of Health   Financial Resource Strain: Not on file  Food Insecurity: Not on file  Transportation Needs: Not on file  Physical Activity: Not on file  Stress: Not on file  Social Connections: Not on file  Intimate Partner Violence: Not on file      Review of Systems     Objective:   Physical Exam Constitutional:      General: She is not in acute distress.    Appearance: Normal appearance. She is normal weight. She is not  ill-appearing or toxic-appearing.  HENT:     Right Ear: Hearing, tympanic membrane and ear canal normal.     Left Ear: Hearing, tympanic membrane and ear canal normal. No decreased hearing noted. No drainage, swelling or tenderness.  No middle ear effusion. No mastoid tenderness.  Cardiovascular:     Rate and Rhythm: Normal rate and regular rhythm.     Heart sounds: Normal heart sounds. No murmur heard. Pulmonary:     Effort: Pulmonary effort is normal. No respiratory distress.     Breath sounds: Normal breath sounds. No stridor. No wheezing, rhonchi or rales.  Chest:     Chest wall: No tenderness.  Abdominal:     General: Abdomen is flat. Bowel sounds are normal. There is no distension.     Palpations: Abdomen is soft.     Tenderness: There is no abdominal tenderness. There is no guarding.  Skin:    Findings: No petechiae.  Neurological:     Mental Status: She is  alert.          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  Resolved now.  No treatment necessary at this time.  Recommend C-spine xray.

## 2020-08-28 ENCOUNTER — Other Ambulatory Visit: Payer: Medicare Other

## 2020-09-18 DIAGNOSIS — D3617 Benign neoplasm of peripheral nerves and autonomic nervous system of trunk, unspecified: Secondary | ICD-10-CM | POA: Diagnosis not present

## 2020-09-18 DIAGNOSIS — D1801 Hemangioma of skin and subcutaneous tissue: Secondary | ICD-10-CM | POA: Diagnosis not present

## 2020-09-18 DIAGNOSIS — L570 Actinic keratosis: Secondary | ICD-10-CM | POA: Diagnosis not present

## 2020-09-18 DIAGNOSIS — Z85828 Personal history of other malignant neoplasm of skin: Secondary | ICD-10-CM | POA: Diagnosis not present

## 2020-09-18 DIAGNOSIS — L814 Other melanin hyperpigmentation: Secondary | ICD-10-CM | POA: Diagnosis not present

## 2020-09-18 DIAGNOSIS — L821 Other seborrheic keratosis: Secondary | ICD-10-CM | POA: Diagnosis not present

## 2020-09-18 DIAGNOSIS — B078 Other viral warts: Secondary | ICD-10-CM | POA: Diagnosis not present

## 2020-09-18 DIAGNOSIS — D225 Melanocytic nevi of trunk: Secondary | ICD-10-CM | POA: Diagnosis not present

## 2020-09-22 ENCOUNTER — Encounter: Payer: Self-pay | Admitting: Family Medicine

## 2020-09-22 DIAGNOSIS — Z1231 Encounter for screening mammogram for malignant neoplasm of breast: Secondary | ICD-10-CM | POA: Diagnosis not present

## 2020-09-22 DIAGNOSIS — Z780 Asymptomatic menopausal state: Secondary | ICD-10-CM | POA: Diagnosis not present

## 2020-09-23 ENCOUNTER — Encounter: Payer: Self-pay | Admitting: *Deleted

## 2020-09-24 DIAGNOSIS — H2513 Age-related nuclear cataract, bilateral: Secondary | ICD-10-CM | POA: Diagnosis not present

## 2020-09-29 ENCOUNTER — Ambulatory Visit (INDEPENDENT_AMBULATORY_CARE_PROVIDER_SITE_OTHER): Payer: Medicare Other | Admitting: Family Medicine

## 2020-09-29 ENCOUNTER — Other Ambulatory Visit: Payer: Self-pay

## 2020-09-29 ENCOUNTER — Encounter: Payer: Self-pay | Admitting: Family Medicine

## 2020-09-29 VITALS — BP 122/68 | HR 84 | Ht 66.0 in | Wt 202.8 lb

## 2020-09-29 DIAGNOSIS — F4321 Adjustment disorder with depressed mood: Secondary | ICD-10-CM

## 2020-09-29 MED ORDER — ALPRAZOLAM 0.5 MG PO TABS: 0.5000 mg | ORAL_TABLET | Freq: Three times a day (TID) | ORAL | 0 refills | Status: DC | PRN

## 2020-09-29 NOTE — Progress Notes (Signed)
Subjective:    Patient ID: Kelli Terrell, female    DOB: [birth year], 70 y.o.   MRN: [id number]  HPI  07/28/20 Episodic sharp Left ear pain.  Occurs every month or so.  Last a day or two and resolves.  Sharp shooting pain that radiates behind the ear from the neck area.  Electrical in nature.  No hearing loss or dizziness. 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  Resolved now.  No treatment necessary at this time.  Recommend C-spine xray.  09/29/20 Unfortunately, since I last saw the patient, her husband died from an invasive perineal squamous cell carcinoma.  She is extremely upset and overcome with grief.  She reports a difficult time sleeping at night.  She perseverates over the thought that she could have done more to save his life.  The situation had been brewing since January of this year.  Initial CT urogram showed no evidence of any neoplasm.  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  Ultimately a CT scan of the abdomen and pelvis in July revealed a suspicious large abscess.  He was referred to a specialist at [facility] and during surgical exploration was found to have an invasive squamous cell carcinoma.  Therefore, the patient feels that she should have pushed the issue harder and may be he could have been saved.  This is keeping her from sleeping at night.  She is extremely tearful on today's exam.  Of note, the pain above her left ear that is shooting and stabbing and electrical in nature has returned.  On examination today, the ear looks completely normal Past Medical History:  Diagnosis Date   AVM (arteriovenous malformation) of colon without hemorrhage    GERD (gastroesophageal reflux disease)    Hyperlipidemia    Hypertension    Internal hemorrhoids    Past Surgical History:  Procedure Laterality Date   CHOLECYSTECTOMY     TONSILLECTOMY     Current Outpatient Medications on File Prior to Visit  Medication Sig Dispense  Refill   amLODipine (NORVASC) 5 MG tablet TAKE 1 TABLET BY MOUTH EVERY DAY 90 tablet 3   Biotin 10 MG TABS Take 10 mg by mouth daily.     Cholecalciferol (VITAMIN D) 50 MCG (2000 UT) tablet Take 2,000 Units by mouth daily.     furosemide (LASIX) 40 MG tablet TAKE 1 TABLET BY MOUTH EVERY DAY 90 tablet 2   gabapentin (NEURONTIN) 300 MG capsule TAKE 2 CAPSULES (600 MG TOTAL) BY MOUTH 2 (TWO) TIMES DAILY. 360 capsule 0   hydrochlorothiazide (HYDRODIURIL) 25 MG tablet TAKE 1 TABLET BY MOUTH EVERY DAY 90 tablet 3   neomycin-polymyxin-hydrocortisone (CORTISPORIN) OTIC solution Place 3 drops into the left ear 4 (four) times daily. 10 mL 0   omeprazole (PRILOSEC) 40 MG capsule TAKE 1 CAPSULE BY MOUTH EVERY DAY 90 capsule 1   No current facility-administered medications on file prior to visit.   Allergies  Allergen Reactions   Mobic [Meloxicam]     ITP with platelet count of 1!  Had COVID-19 at the same time.     Codeine Nausea And Vomiting   Social History   Socioeconomic History   Marital status: Married    Spouse name: Not on file   Number of children: Not on file   Years of education: Not on file   Highest education level: Not on file  Occupational History   Not on file  Tobacco Use   Smoking status: Never   Smokeless tobacco: Never  Substance and Sexual Activity   Alcohol use: Yes    Comment: Rare   Drug use: No   Sexual activity: Not on file  Other Topics Concern   Not on file  Social History Narrative   Married. Retired.    Never smoked.    Social Determinants of Health   Financial Resource Strain: Not on file  Food Insecurity: Not on file  Transportation Needs: Not on file  Physical Activity: Not on file  Stress: Not on file  Social Connections: Not on file  Intimate Partner Violence: Not on file      Review of Systems     Objective:   Physical Exam Constitutional:      General: She is not in acute distress.    Appearance: Normal appearance. She is normal  weight. She is not ill-appearing or toxic-appearing.  HENT:     Right Ear: Hearing, tympanic membrane and ear canal normal.     Left Ear: Hearing, tympanic membrane and ear canal normal. No decreased hearing noted. No drainage, swelling or tenderness.  No middle ear effusion. No mastoid tenderness.  Cardiovascular:     Rate and Rhythm: Normal rate and regular rhythm.     Heart sounds: Normal heart sounds. No murmur heard. Pulmonary:     Effort: Pulmonary effort is normal. No respiratory distress.     Breath sounds: Normal breath sounds. No stridor. No wheezing, rhonchi or rales.  Chest:     Chest wall: No tenderness.  Abdominal:     General: Abdomen is flat. Bowel sounds are normal. There is no distension.     Palpations: Abdomen is soft.     Tenderness: There is no abdominal tenderness. There is no guarding.  Skin:    Findings: No petechiae.  Neurological:     Mental Status: She is alert.          Assessment & Plan:  Grief reaction I offer my condolences to the patient.  She was a strong advocate for her husband and I tried to reassure her that she did everything that she could to help him.  Unfortunately sometimes, despite our best efforts, patients cannot be saved.  I will give the patient Xanax 0.5 mg p.o. every 8 hours as needed anxiety or insomnia.  I believe the electrical pain around her ear is likely neuropathic and I believe it may be brought on partly due to muscle spasms and bruxism.  If the pain improves on Xanax, I think that would limb proved to that argument.  She will let me know if the pain persist.

## 2020-10-02 ENCOUNTER — Encounter: Payer: Self-pay | Admitting: Family Medicine

## 2020-10-02 DIAGNOSIS — R928 Other abnormal and inconclusive findings on diagnostic imaging of breast: Secondary | ICD-10-CM | POA: Diagnosis not present

## 2020-10-02 DIAGNOSIS — N6321 Unspecified lump in the left breast, upper outer quadrant: Secondary | ICD-10-CM | POA: Diagnosis not present

## 2020-10-02 DIAGNOSIS — N6002 Solitary cyst of left breast: Secondary | ICD-10-CM | POA: Diagnosis not present

## 2020-10-02 DIAGNOSIS — R922 Inconclusive mammogram: Secondary | ICD-10-CM | POA: Diagnosis not present

## 2020-11-17 ENCOUNTER — Other Ambulatory Visit: Payer: Self-pay | Admitting: Family Medicine

## 2021-01-12 ENCOUNTER — Other Ambulatory Visit: Payer: Self-pay | Admitting: Family Medicine

## 2021-03-09 ENCOUNTER — Other Ambulatory Visit: Payer: Medicare Other

## 2021-03-10 ENCOUNTER — Other Ambulatory Visit: Payer: Self-pay

## 2021-03-10 ENCOUNTER — Other Ambulatory Visit: Payer: Medicare Other

## 2021-03-10 DIAGNOSIS — I10 Essential (primary) hypertension: Secondary | ICD-10-CM | POA: Diagnosis not present

## 2021-03-10 DIAGNOSIS — E7849 Other hyperlipidemia: Secondary | ICD-10-CM

## 2021-03-10 DIAGNOSIS — R739 Hyperglycemia, unspecified: Secondary | ICD-10-CM | POA: Diagnosis not present

## 2021-03-12 ENCOUNTER — Other Ambulatory Visit: Payer: Self-pay

## 2021-03-12 ENCOUNTER — Encounter: Payer: Self-pay | Admitting: Family Medicine

## 2021-03-12 ENCOUNTER — Ambulatory Visit (INDEPENDENT_AMBULATORY_CARE_PROVIDER_SITE_OTHER): Payer: Medicare Other | Admitting: Family Medicine

## 2021-03-12 VITALS — BP 124/78 | HR 68 | Temp 97.2°F | Resp 18 | Ht 66.0 in | Wt 203.0 lb

## 2021-03-12 DIAGNOSIS — R739 Hyperglycemia, unspecified: Secondary | ICD-10-CM | POA: Diagnosis not present

## 2021-03-12 DIAGNOSIS — E7849 Other hyperlipidemia: Secondary | ICD-10-CM

## 2021-03-12 DIAGNOSIS — I10 Essential (primary) hypertension: Secondary | ICD-10-CM

## 2021-03-12 DIAGNOSIS — Z0001 Encounter for general adult medical examination with abnormal findings: Secondary | ICD-10-CM | POA: Diagnosis not present

## 2021-03-12 DIAGNOSIS — Z0000 Encounter for general adult medical examination without abnormal findings: Secondary | ICD-10-CM

## 2021-03-12 MED ORDER — TRIAMCINOLONE ACETONIDE 0.1 % EX CREA: 1.0000 "application " | TOPICAL_CREAM | Freq: Two times a day (BID) | CUTANEOUS | 0 refills | Status: DC

## 2021-03-12 NOTE — Progress Notes (Signed)
Subjective:    Patient ID: Kelli Terrell, female    DOB: [birth year], 71 y.o.   MRN: [id number]  HPI Patient is a very pleasant 71 year old Caucasian female here today for complete physical exam.  Reviewing her immunizations show that all of her immunizations including shingles vaccine and pneumonia vaccines are up-to-date.  She is due for a tetanus shot next year.  Her last colonoscopy was in 2019 and was clear.  Her mammogram is due in September.  She had a bone density last September that was excellent.  She is taking vitamin D but she is not taking calcium.  Recently she has been taking hydrochlorothiazide and furosemide together.  I believe that this likely explains the elevation in her creatinine.  She has been taking the Lasix due to swelling in her legs.  I recommended that she stop the hydrochlorothiazide and just use the Lasix to avoid dehydration.  Also on her most recent fasting lab work, her blood sugar was elevated at 127 and her cholesterol was elevated.  Patient is still grieving the loss of her husband as well and anticipate.  She states that she is only using the Xanax occasionally.  We discussed depression today and she declines antidepressants.  She states that she does not feel that she needs them. Immunization History  Administered Date(s) Administered   Fluad Quad(high Dose 65+) 09/27/2018, 10/04/2019   Influenza, High Dose Seasonal PF 11/30/2017   Influenza,inj,Quad PF,6+ Mos 11/23/2012, 12/20/2013, 11/01/2014, 01/08/2016, 10/21/2016   Influenza-Unspecified 12/21/2010, 10/04/2011   Moderna Sars-Covid-2 Vaccination 05/10/2019, 06/07/2019   Pneumococcal Conjugate-13 12/27/2014   Pneumococcal Polysaccharide-23 12/20/2013   Tdap 12/14/2012   Zoster Recombinat (Shingrix) 02/09/2008    Lab on 03/10/2021  Component Date Value Ref Range Status   Cholesterol 03/10/2021 226 (H)  <200 mg/dL Final   HDL 03/10/2021 52  > OR = 50 mg/dL Final   Triglycerides 03/10/2021 159 (H)  <150  mg/dL Final   LDL Cholesterol (Calc) 03/10/2021 145 (H)  mg/dL (calc) Final   Comment: Reference range: <100 . Desirable range <100 mg/dL for primary prevention;   <70 mg/dL for patients with CHD or diabetic patients  with > or = 2 CHD risk factors. Marland Kitchen LDL-C is now calculated using the Martin-Hopkins  calculation, which is a validated novel method providing  better accuracy than the Friedewald equation in the  estimation of LDL-C.  Cresenciano Genre et al. Annamaria Helling. 5852;778(24): 2061-2068  (http://education.[facility].com/faq/FAQ164)    Total CHOL/HDL Ratio 03/10/2021 4.3  <5.0 (calc) Final   Non-HDL Cholesterol (Calc) 03/10/2021 174 (H)  <130 mg/dL (calc) Final   Comment: For patients with diabetes plus 1 major ASCVD risk  factor, treating to a non-HDL-C goal of <100 mg/dL  (LDL-C of <70 mg/dL) is considered a therapeutic  option.    WBC 03/10/2021 7.0  3.8 - 10.8 Thousand/uL Final   RBC 03/10/2021 4.39  3.80 - 5.10 Million/uL Final   Hemoglobin 03/10/2021 12.8  11.7 - 15.5 g/dL Final   HCT 03/10/2021 38.7  35.0 - 45.0 % Final   MCV 03/10/2021 88.2  80.0 - 100.0 fL Final   MCH 03/10/2021 29.2  27.0 - 33.0 pg Final   MCHC 03/10/2021 33.1  32.0 - 36.0 g/dL Final   RDW 03/10/2021 12.3  11.0 - 15.0 % Final   Platelets 03/10/2021 258  140 - 400 Thousand/uL Final   MPV 03/10/2021 12.4  7.5 - 12.5 fL Final   Neutro Abs 03/10/2021 3,472  1,500 - 7,800 cells/uL  Final   Lymphs Abs 03/10/2021 2,779  850 - 3,900 cells/uL Final   Absolute Monocytes 03/10/2021 469  200 - 950 cells/uL Final   Eosinophils Absolute 03/10/2021 210  15 - 500 cells/uL Final   Basophils Absolute 03/10/2021 70  0 - 200 cells/uL Final   Neutrophils Relative % 03/10/2021 49.6  % Final   Total Lymphocyte 03/10/2021 39.7  % Final   Monocytes Relative 03/10/2021 6.7  % Final   Eosinophils Relative 03/10/2021 3.0  % Final   Basophils Relative 03/10/2021 1.0  % Final   Glucose, Bld 03/10/2021 127 (H)  65 - 99 mg/dL Final    Comment: .            Fasting reference interval . For someone without known diabetes, a glucose value >125 mg/dL indicates that they may have diabetes and this should be confirmed with a follow-up test. .    BUN 03/10/2021 23  7 - 25 mg/dL Final   Creat 03/10/2021 1.03 (H)  0.60 - 1.00 mg/dL Final   BUN/Creatinine Ratio 03/10/2021 22  6 - 22 (calc) Final   Sodium 03/10/2021 141  135 - 146 mmol/L Final   Potassium 03/10/2021 3.7  3.5 - 5.3 mmol/L Final   Chloride 03/10/2021 99  98 - 110 mmol/L Final   CO2 03/10/2021 31  20 - 32 mmol/L Final   Calcium 03/10/2021 10.8 (H)  8.6 - 10.4 mg/dL Final   Total Protein 03/10/2021 7.1  6.1 - 8.1 g/dL Final   Albumin 03/10/2021 4.0  3.6 - 5.1 g/dL Final   Globulin 03/10/2021 3.1  1.9 - 3.7 g/dL (calc) Final   AG Ratio 03/10/2021 1.3  1.0 - 2.5 (calc) Final   Total Bilirubin 03/10/2021 0.4  0.2 - 1.2 mg/dL Final   Alkaline phosphatase (APISO) 03/10/2021 75  37 - 153 U/L Final   AST 03/10/2021 14  10 - 35 U/L Final   ALT 03/10/2021 12  6 - 29 U/L Final    Past Medical History:  Diagnosis Date   AVM (arteriovenous malformation) of colon without hemorrhage    GERD (gastroesophageal reflux disease)    Hyperlipidemia    Hypertension    Internal hemorrhoids    Past Surgical History:  Procedure Laterality Date   CHOLECYSTECTOMY     TONSILLECTOMY     Current Outpatient Medications on File Prior to Visit  Medication Sig Dispense Refill   ALPRAZolam (XANAX) 0.5 MG tablet Take 1 tablet (0.5 mg total) by mouth 3 (three) times daily as needed for anxiety or sleep. 30 tablet 0   amLODipine (NORVASC) 5 MG tablet TAKE 1 TABLET BY MOUTH EVERY DAY 90 tablet 3   Biotin 10 MG TABS Take 10 mg by mouth daily.     Cholecalciferol (VITAMIN D) 50 MCG (2000 UT) tablet Take 2,000 Units by mouth daily.     furosemide (LASIX) 40 MG tablet TAKE 1 TABLET BY MOUTH EVERY DAY 90 tablet 2   gabapentin (NEURONTIN) 300 MG capsule TAKE 2 CAPSULES BY MOUTH 2 TIMES DAILY.  360 capsule 1   neomycin-polymyxin-hydrocortisone (CORTISPORIN) OTIC solution Place 3 drops into the left ear 4 (four) times daily. 10 mL 0   omeprazole (PRILOSEC) 40 MG capsule TAKE 1 CAPSULE BY MOUTH EVERY DAY 90 capsule 1   No current facility-administered medications on file prior to visit.   Allergies  Allergen Reactions   Mobic [Meloxicam]     ITP with platelet count of 1!  Had COVID-19 at the same  time.     Codeine Nausea And Vomiting   Social History   Socioeconomic History   Marital status: Married    Spouse name: Not on file   Number of children: Not on file   Years of education: Not on file   Highest education level: Not on file  Occupational History   Not on file  Tobacco Use   Smoking status: Never   Smokeless tobacco: Never  Substance and Sexual Activity   Alcohol use: Yes    Comment: Rare   Drug use: No   Sexual activity: Not on file  Other Topics Concern   Not on file  Social History Narrative   Married. Retired.    Never smoked.    Social Determinants of Health   Financial Resource Strain: Not on file  Food Insecurity: Not on file  Transportation Needs: Not on file  Physical Activity: Not on file  Stress: Not on file  Social Connections: Not on file  Intimate Partner Violence: Not on file   Family History  Problem Relation Age of Onset   Cancer Father        kidney   Stroke Brother    Cancer Brother        Thyroid Cancer   Heart disease Brother      Review of Systems  All other systems reviewed and are negative.     Objective:   Physical Exam Vitals reviewed.  Constitutional:      General: She is not in acute distress.    Appearance: She is well-developed. She is not diaphoretic.  HENT:     Head: Normocephalic and atraumatic.     Right Ear: External ear normal.     Left Ear: External ear normal.     Nose: Nose normal.     Mouth/Throat:     Pharynx: No oropharyngeal exudate.  Eyes:     General: No scleral icterus.       Right  eye: No discharge.        Left eye: No discharge.     Conjunctiva/sclera: Conjunctivae normal.     Pupils: Pupils are equal, round, and reactive to light.  Neck:     Thyroid: No thyromegaly.     Vascular: No JVD.     Trachea: No tracheal deviation.  Cardiovascular:     Rate and Rhythm: Normal rate and regular rhythm.     Heart sounds: Normal heart sounds. No murmur heard.   No friction rub. No gallop.  Pulmonary:     Effort: Pulmonary effort is normal. No respiratory distress.     Breath sounds: Normal breath sounds. No stridor. No wheezing or rales.  Chest:     Chest wall: No tenderness.  Abdominal:     General: Bowel sounds are normal. There is no distension.     Palpations: Abdomen is soft. There is no mass.     Tenderness: There is no abdominal tenderness. There is no guarding or rebound.  Musculoskeletal:        General: No tenderness. Normal range of motion.     Cervical back: Normal range of motion and neck supple.  Lymphadenopathy:     Cervical: No cervical adenopathy.  Skin:    General: Skin is warm.     Coloration: Skin is not pale.     Findings: No erythema or rash.  Neurological:     Mental Status: She is alert and oriented to person, place, and time.     Cranial  Nerves: No cranial nerve deficit.     Motor: No abnormal muscle tone.     Coordination: Coordination normal.     Deep Tendon Reflexes: Reflexes are normal and symmetric.  Psychiatric:        Behavior: Behavior normal.        Thought Content: Thought content normal.        Judgment: Judgment normal.    1 cm eczema-like patch on the center of her chest that we will treat with triamcinolone.  If persistent will need a biopsy      Assessment & Plan:  Elevated blood sugar - Plan: Hemoglobin A1c  Routine general medical examination at a health care facility  Other hyperlipidemia  Essential hypertension Patient denies any depression, falls, memory loss.  Immunizations are up-to-date.  Mammogram and  colonoscopy up-to-date.  Bone density test is excellent.  I am concerned about her blood sugar.  I will check an A1c.  I am also concerned by her cholesterol.  If her A1c is elevated I would recommend starting statin to lower her cholesterol.  Also would recommend a low carbohydrate diet.  Await results of A1c to give context to the one-time blood sugar of 465 and its implications.

## 2021-03-13 LAB — COMPREHENSIVE METABOLIC PANEL
AG Ratio: 1.3 (calc) (ref 1.0–2.5)
ALT: 12 U/L (ref 6–29)
AST: 14 U/L (ref 10–35)
Albumin: 4 g/dL (ref 3.6–5.1)
Alkaline phosphatase (APISO): 75 U/L (ref 37–153)
BUN/Creatinine Ratio: 22 (calc) (ref 6–22)
BUN: 23 mg/dL (ref 7–25)
CO2: 31 mmol/L (ref 20–32)
Calcium: 10.8 mg/dL — ABNORMAL HIGH (ref 8.6–10.4)
Chloride: 99 mmol/L (ref 98–110)
Creat: 1.03 mg/dL — ABNORMAL HIGH (ref 0.60–1.00)
Globulin: 3.1 g/dL (calc) (ref 1.9–3.7)
Glucose, Bld: 127 mg/dL — ABNORMAL HIGH (ref 65–99)
Potassium: 3.7 mmol/L (ref 3.5–5.3)
Sodium: 141 mmol/L (ref 135–146)
Total Bilirubin: 0.4 mg/dL (ref 0.2–1.2)
Total Protein: 7.1 g/dL (ref 6.1–8.1)

## 2021-03-13 LAB — LIPID PANEL
Cholesterol: 226 mg/dL — ABNORMAL HIGH (ref <200)
HDL: 52 mg/dL (ref >=50)
LDL Cholesterol (Calc): 145 mg/dL (calc) — ABNORMAL HIGH
Non-HDL Cholesterol (Calc): 174 mg/dL (calc) — ABNORMAL HIGH (ref <130)
Total CHOL/HDL Ratio: 4.3 (calc) (ref <5.0)
Triglycerides: 159 mg/dL — ABNORMAL HIGH (ref <150)

## 2021-03-13 LAB — CBC WITH DIFFERENTIAL/PLATELET
Absolute Monocytes: 469 cells/uL (ref 200–950)
Basophils Absolute: 70 cells/uL (ref 0–200)
Basophils Relative: 1 %
Eosinophils Absolute: 210 cells/uL (ref 15–500)
Eosinophils Relative: 3 %
HCT: 38.7 % (ref 35.0–45.0)
Hemoglobin: 12.8 g/dL (ref 11.7–15.5)
Lymphs Abs: 2779 cells/uL (ref 850–3900)
MCH: 29.2 pg (ref 27.0–33.0)
MCHC: 33.1 g/dL (ref 32.0–36.0)
MCV: 88.2 fL (ref 80.0–100.0)
MPV: 12.4 fL (ref 7.5–12.5)
Monocytes Relative: 6.7 %
Neutro Abs: 3472 cells/uL (ref 1500–7800)
Neutrophils Relative %: 49.6 %
Platelets: 258 10*3/uL (ref 140–400)
RBC: 4.39 10*6/uL (ref 3.80–5.10)
RDW: 12.3 % (ref 11.0–15.0)
Total Lymphocyte: 39.7 %
WBC: 7 10*3/uL (ref 3.8–10.8)

## 2021-03-13 LAB — TEST AUTHORIZATION

## 2021-03-13 LAB — HEMOGLOBIN A1C W/OUT EAG: Hgb A1c MFr Bld: 6.4 % of total Hgb — ABNORMAL HIGH (ref <5.7)

## 2021-04-09 ENCOUNTER — Other Ambulatory Visit: Payer: Self-pay | Admitting: Family Medicine

## 2021-04-09 NOTE — Telephone Encounter (Signed)
Rx states: ?The original prescription was discontinued on 03/12/2021 by Susy Frizzle, MD. Renewing this prescription may not be appropriate. ? ?Nothing in 03/12/21 OV note about stopping this.  ? ?Please advise if pt should be on this medication. Thanks! ?

## 2021-05-19 ENCOUNTER — Ambulatory Visit (INDEPENDENT_AMBULATORY_CARE_PROVIDER_SITE_OTHER): Payer: Medicare Other | Admitting: Family Medicine

## 2021-05-19 VITALS — BP 120/70 | HR 89 | Temp 98.2°F | Ht 66.0 in | Wt 206.0 lb

## 2021-05-19 DIAGNOSIS — W57XXXA Bitten or stung by nonvenomous insect and other nonvenomous arthropods, initial encounter: Secondary | ICD-10-CM

## 2021-05-19 DIAGNOSIS — S20462A Insect bite (nonvenomous) of left back wall of thorax, initial encounter: Secondary | ICD-10-CM | POA: Diagnosis not present

## 2021-05-19 MED ORDER — DOXYCYCLINE HYCLATE 100 MG PO TABS: 100.0000 mg | ORAL_TABLET | Freq: Two times a day (BID) | ORAL | 0 refills | Status: DC

## 2021-05-19 NOTE — Progress Notes (Signed)
? ?Subjective:  ? ? Patient ID: Kelli Terrell, female    DOB: 1950-08-26, 71 y.o.   MRN: [id number] ? ?HPI  ?Patient was bitten by a tick on Friday.  She noticed the tick Sunday and removed it.  There is now a red welt just above her bra line on her back adjacent to the left scapula.  It itches.  There is no erythema migrans.  She denies any fevers or chills or headache or neck stiffness. ?Past Medical History:  ?Diagnosis Date  ? AVM (arteriovenous malformation) of colon without hemorrhage   ? GERD (gastroesophageal reflux disease)   ? Hyperlipidemia   ? Hypertension   ? Internal hemorrhoids   ? ?Past Surgical History:  ?Procedure Laterality Date  ? CHOLECYSTECTOMY    ? TONSILLECTOMY    ? ?Current Outpatient Medications on File Prior to Visit  ?Medication Sig Dispense Refill  ? ALPRAZolam (XANAX) 0.5 MG tablet Take 1 tablet (0.5 mg total) by mouth 3 (three) times daily as needed for anxiety or sleep. 30 tablet 0  ? amLODipine (NORVASC) 5 MG tablet TAKE 1 TABLET BY MOUTH EVERY DAY 90 tablet 3  ? Biotin 10 MG TABS Take 10 mg by mouth daily.    ? Cholecalciferol (VITAMIN D) 50 MCG (2000 UT) tablet Take 2,000 Units by mouth daily.    ? furosemide (LASIX) 40 MG tablet TAKE 1 TABLET BY MOUTH EVERY DAY 90 tablet 2  ? gabapentin (NEURONTIN) 300 MG capsule TAKE 2 CAPSULES BY MOUTH 2 TIMES DAILY. 360 capsule 1  ? neomycin-polymyxin-hydrocortisone (CORTISPORIN) OTIC solution Place 3 drops into the left ear 4 (four) times daily. 10 mL 0  ? omeprazole (PRILOSEC) 40 MG capsule TAKE 1 CAPSULE BY MOUTH EVERY DAY 90 capsule 1  ? triamcinolone cream (KENALOG) 0.1 % Apply 1 application topically 2 (two) times daily. 30 g 0  ? ?No current facility-administered medications on file prior to visit.  ? ?Allergies  ?Allergen Reactions  ? Mobic [Meloxicam]   ?  ITP with platelet count of 1!  Had COVID-19 at the same time.    ? Codeine Nausea And Vomiting  ? ?Social History  ? ?Socioeconomic History  ? Marital status: Married  ?  Spouse name:  Not on file  ? Number of children: Not on file  ? Years of education: Not on file  ? Highest education level: Not on file  ?Occupational History  ? Not on file  ?Tobacco Use  ? Smoking status: Never  ? Smokeless tobacco: Never  ?Substance and Sexual Activity  ? Alcohol use: Yes  ?  Comment: Rare  ? Drug use: No  ? Sexual activity: Not on file  ?Other Topics Concern  ? Not on file  ?Social History Narrative  ? Married. Retired.   ? Never smoked.   ? ?Social Determinants of Health  ? ?Financial Resource Strain: Not on file  ?Food Insecurity: Not on file  ?Transportation Needs: Not on file  ?Physical Activity: Not on file  ?Stress: Not on file  ?Social Connections: Not on file  ?Intimate Partner Violence: Not on file  ? ? ? ? ?Review of Systems ? ?   ?Objective:  ? Physical Exam ?Constitutional:   ?   General: She is not in acute distress. ?   Appearance: Normal appearance. She is normal weight. She is not ill-appearing or toxic-appearing.  ?HENT:  ?   Right Ear: Hearing, tympanic membrane and ear canal normal.  ?   Left Ear:  Hearing, tympanic membrane and ear canal normal. No decreased hearing noted. No drainage, swelling or tenderness.  No middle ear effusion. No mastoid tenderness.  ?Cardiovascular:  ?   Rate and Rhythm: Normal rate and regular rhythm.  ?   Heart sounds: Normal heart sounds. No murmur heard. ?Pulmonary:  ?   Effort: Pulmonary effort is normal. No respiratory distress.  ?   Breath sounds: Normal breath sounds. No stridor. No wheezing, rhonchi or rales.  ?Chest:  ?   Chest wall: No tenderness.  ?Abdominal:  ?   General: Abdomen is flat. Bowel sounds are normal. There is no distension.  ?   Palpations: Abdomen is soft.  ?   Tenderness: There is no abdominal tenderness. There is no guarding.  ?Skin: ?   Findings: No petechiae.  ?Neurological:  ?   Mental Status: She is alert.  ? ? ? ? ? ?   ?Assessment & Plan:  ?Tick bite of left back wall of thorax, initial encounter ?I removed a residual portion of  the tick jaw using a pair of forceps and needle.  I recommended monitoring the area for any development of erythema migrans.  I sent the patient several photographs of what to watch for.  If he develops a spreading red ring or if she starts having fever, headaches, muscle aches, neck stiffness, or symptoms of illness, I will restart cycling for which I gave her a prescription.  Otherwise I will treat this area as an insect bite and simply treated with Cortisporin. ?

## 2021-06-20 ENCOUNTER — Other Ambulatory Visit: Payer: Self-pay | Admitting: Family Medicine

## 2021-06-22 NOTE — Telephone Encounter (Signed)
D/C 03/12/21.

## 2021-06-26 ENCOUNTER — Other Ambulatory Visit: Payer: Self-pay | Admitting: Family Medicine

## 2021-06-26 NOTE — Telephone Encounter (Signed)
Requested by interface surescripts. Medication discontinued 03/12/21. Requested Prescriptions  Refused Prescriptions Disp Refills  . hydrochlorothiazide (HYDRODIURIL) 25 MG tablet [Pharmacy Med Name: HYDROCHLOROTHIAZIDE 25 MG TAB] 90 tablet 3    Sig: TAKE 1 TABLET BY MOUTH EVERY DAY     Cardiovascular: Diuretics - Thiazide Failed - 06/26/2021  2:45 PM      Failed - Cr in normal range and within 180 days    Creat  Date Value Ref Range Status  03/10/2021 1.03 (H) 0.60 - 1.00 mg/dL Final         Passed - K in normal range and within 180 days    Potassium  Date Value Ref Range Status  03/10/2021 3.7 3.5 - 5.3 mmol/L Final         Passed - Na in normal range and within 180 days    Sodium  Date Value Ref Range Status  03/10/2021 141 135 - 146 mmol/L Final         Passed - Last BP in normal range    BP Readings from Last 1 Encounters:  05/19/21 120/70         Passed - Valid encounter within last 6 months    Recent Outpatient Visits          1 month ago Tick bite of left back wall of thorax, initial encounter   [place] Susy Frizzle, MD   3 months ago Elevated blood sugar   [place] [facility] Schaumann, Cammie Mcgee, MD   9 months ago Grief reaction   [place], Cammie Mcgee, MD   11 months ago Otalgia of left ear   [place] Pickard, Cammie Mcgee, MD   1 year ago Routine general medical examination at a health care facility   [place], Cammie Mcgee, MD

## 2021-07-11 ENCOUNTER — Other Ambulatory Visit: Payer: Self-pay | Admitting: Family Medicine

## 2021-07-13 NOTE — Telephone Encounter (Signed)
Not on current med list. Requested Prescriptions  Pending Prescriptions Disp Refills  . hydrochlorothiazide (HYDRODIURIL) 25 MG tablet [Pharmacy Med Name: HYDROCHLOROTHIAZIDE 25 MG TAB] 90 tablet 3    Sig: TAKE 1 TABLET BY MOUTH EVERY DAY     Cardiovascular: Diuretics - Thiazide Failed - 07/11/2021 11:27 AM      Failed - Cr in normal range and within 180 days    Creat  Date Value Ref Range Status  03/10/2021 1.03 (H) 0.60 - 1.00 mg/dL Final         Passed - K in normal range and within 180 days    Potassium  Date Value Ref Range Status  03/10/2021 3.7 3.5 - 5.3 mmol/L Final         Passed - Na in normal range and within 180 days    Sodium  Date Value Ref Range Status  03/10/2021 141 135 - 146 mmol/L Final         Passed - Last BP in normal range    BP Readings from Last 1 Encounters:  05/19/21 120/70         Passed - Valid encounter within last 6 months    Recent Outpatient Visits          1 month ago Tick bite of left back wall of thorax, initial encounter   [place] Susy Frizzle, MD   4 months ago Elevated blood sugar   [place] [facility] Schaumann, Cammie Mcgee, MD   9 months ago Grief reaction   [place], Cammie Mcgee, MD   11 months ago Otalgia of left ear   [place] Pickard, Cammie Mcgee, MD   1 year ago Routine general medical examination at a health care facility   [place], Cammie Mcgee, MD

## 2021-09-21 ENCOUNTER — Telehealth: Payer: Self-pay

## 2021-09-21 ENCOUNTER — Telehealth: Payer: Self-pay | Admitting: Family Medicine

## 2021-09-21 ENCOUNTER — Other Ambulatory Visit: Payer: Self-pay | Admitting: Family Medicine

## 2021-09-21 MED ORDER — FUROSEMIDE 40 MG PO TABS: 40.0000 mg | ORAL_TABLET | Freq: Every day | ORAL | 2 refills | Status: DC

## 2021-09-21 MED ORDER — OMEPRAZOLE 40 MG PO CPDR: 40.0000 mg | DELAYED_RELEASE_CAPSULE | Freq: Every day | ORAL | 2 refills | Status: DC

## 2021-09-21 MED ORDER — GABAPENTIN 300 MG PO CAPS: ORAL_CAPSULE | ORAL | 1 refills | Status: DC

## 2021-09-21 NOTE — Telephone Encounter (Signed)
Pharmacy faxed a refill request for gabapentin (NEURONTIN) 300 MG capsule [[id number]]    Order Details Dose, Route, Frequency: As Directed  Dispense Quantity: 360 capsule Refills: 1        Sig: TAKE 2 CAPSULES BY MOUTH 2 TIMES DAILY.       Start Date: 09/21/21 End Date: --  Written Date: 09/21/21 Expiration Date: 09/21/22  Original Order:  gabapentin (NEURONTIN) 300 MG capsule [[id number]]    LOV: 05/19/21  PHARMACY: [facility] [address] IN [place]

## 2021-09-21 NOTE — Telephone Encounter (Signed)
Patient came to the office to request a refill of  furosemide (LASIX) 40 MG tablet [[id number]]   gabapentin (NEURONTIN) 300 MG capsule [[id number]]   omeprazole (PRILOSEC) 40 MG capsule [[id number]]   Pharmacy confirmed as:  [id number] [place], [place] [address] [id number] Phone:  [phone] Fax:  [phone] DEA #:  [id number] LOV: 05/19/2021   Patient took last dose of furosemide  2 days ago - ankle and hands already swollen.  Pharmacy told patient they don't have the prescription in their system any longer.   Please advise at [phone]

## 2021-09-25 DIAGNOSIS — H2513 Age-related nuclear cataract, bilateral: Secondary | ICD-10-CM | POA: Diagnosis not present

## 2021-09-28 DIAGNOSIS — Z1231 Encounter for screening mammogram for malignant neoplasm of breast: Secondary | ICD-10-CM | POA: Diagnosis not present

## 2021-09-28 LAB — HM MAMMOGRAPHY

## 2021-09-29 DIAGNOSIS — C44319 Basal cell carcinoma of skin of other parts of face: Secondary | ICD-10-CM | POA: Diagnosis not present

## 2021-09-29 DIAGNOSIS — L814 Other melanin hyperpigmentation: Secondary | ICD-10-CM | POA: Diagnosis not present

## 2021-09-29 DIAGNOSIS — C44311 Basal cell carcinoma of skin of nose: Secondary | ICD-10-CM | POA: Diagnosis not present

## 2021-09-29 DIAGNOSIS — D0462 Carcinoma in situ of skin of left upper limb, including shoulder: Secondary | ICD-10-CM | POA: Diagnosis not present

## 2021-09-29 DIAGNOSIS — L438 Other lichen planus: Secondary | ICD-10-CM | POA: Diagnosis not present

## 2021-09-29 DIAGNOSIS — L821 Other seborrheic keratosis: Secondary | ICD-10-CM | POA: Diagnosis not present

## 2021-09-29 DIAGNOSIS — L570 Actinic keratosis: Secondary | ICD-10-CM | POA: Diagnosis not present

## 2021-09-29 DIAGNOSIS — Z85828 Personal history of other malignant neoplasm of skin: Secondary | ICD-10-CM | POA: Diagnosis not present

## 2021-09-29 DIAGNOSIS — D1801 Hemangioma of skin and subcutaneous tissue: Secondary | ICD-10-CM | POA: Diagnosis not present

## 2021-11-10 DIAGNOSIS — C44311 Basal cell carcinoma of skin of nose: Secondary | ICD-10-CM | POA: Diagnosis not present

## 2021-11-10 DIAGNOSIS — C44319 Basal cell carcinoma of skin of other parts of face: Secondary | ICD-10-CM | POA: Diagnosis not present

## 2021-12-02 ENCOUNTER — Other Ambulatory Visit: Payer: Self-pay

## 2021-12-02 MED ORDER — AMLODIPINE BESYLATE 5 MG PO TABS: 5.0000 mg | ORAL_TABLET | Freq: Every day | ORAL | 0 refills | Status: DC

## 2021-12-02 NOTE — Telephone Encounter (Signed)
Requested Prescriptions  Pending Prescriptions Disp Refills   amLODipine (NORVASC) 5 MG tablet 90 tablet 3    Sig: Take 1 tablet (5 mg total) by mouth daily. OFFICE VISIT NEEDED FOR ADDITIONAL REFILLS     Cardiovascular: Calcium Channel Blockers 2 Failed - 12/02/2021 11:24 AM      Failed - Valid encounter within last 6 months    Recent Outpatient Visits           6 months ago Tick bite of left back wall of thorax, initial encounter   [place] Schaumann, Cammie Mcgee, MD   8 months ago Elevated blood sugar   [place] Pickard, Cammie Mcgee, MD   1 year ago Grief reaction   [place] [facility] Schaumann, Cammie Mcgee, MD   1 year ago Otalgia of left ear   [place] Pickard, Cammie Mcgee, MD   1 year ago Routine general medical examination at a health care facility   [place], Cammie Mcgee, MD              Passed - Last BP in normal range    BP Readings from Last 1 Encounters:  05/19/21 120/70         Passed - Last Heart Rate in normal range    Pulse Readings from Last 1 Encounters:  05/19/21 89

## 2021-12-02 NOTE — Telephone Encounter (Signed)
  Prescription Request  12/02/2021  Is this a "Controlled Substance" medicine? No  LOV: 09/21/2021   What is the name of the medication or equipment? amLODipine (NORVASC) 5 MG tablet [[id number]]   Have you contacted your pharmacy to request a refill? Yes   Which pharmacy would you like this sent to?  [id number] [place] [address] [place] [address]Phone: [phone]: [phone]  Patient notified that their request is being sent to the clinical staff for review and that they should receive a response within 2 business days.   Please advise at [phone]

## 2021-12-24 ENCOUNTER — Telehealth: Payer: Self-pay | Admitting: Family Medicine

## 2021-12-24 ENCOUNTER — Telehealth: Payer: Self-pay | Admitting: *Deleted

## 2021-12-24 NOTE — Telephone Encounter (Signed)
Received request for HCTZ '25mg'$ . Not on current med profile, med was discontinued 03/12/21.   Called pt for clarification.   States she would like to go back on medication. Was changed to Furosemide 03/12/21 for edema and HCTZ stopped.   States there is no difference in swelling of feet and has urgency and some incontinence with the Furosemide. States BP "Has been fine." Please advise.

## 2021-12-24 NOTE — Telephone Encounter (Signed)
Prescription Request  12/24/2021  Is this a "Controlled Substance" medicine? No  LOV: Visit date not found  What is the name of the medication or equipment? Hydrochlorothiazide 25 mg tab 90 day supply   Have you contacted your pharmacy to request a refill? Yes   Which pharmacy would you like this sent to?  [id number] [place] [address] [address]Phone: [phone]: [phone]   Patient notified that their request is being sent to the clinical staff for review and that they should receive a response within 2 business days.   [phone]

## 2021-12-25 ENCOUNTER — Other Ambulatory Visit: Payer: Self-pay | Admitting: Family Medicine

## 2021-12-25 MED ORDER — HYDROCHLOROTHIAZIDE 25 MG PO TABS: 25.0000 mg | ORAL_TABLET | Freq: Every day | ORAL | 3 refills | Status: DC

## 2022-01-28 ENCOUNTER — Encounter: Payer: Self-pay | Admitting: Family Medicine

## 2022-03-04 ENCOUNTER — Other Ambulatory Visit: Payer: Self-pay | Admitting: Family Medicine

## 2022-03-04 NOTE — Telephone Encounter (Signed)
Requested medication (s) are due for refill today: yes  Requested medication (s) are on the active medication list: yes  Last refill:  12/02/21 #90  Future visit scheduled: yes  Notes to clinic:  note attached to previous refill : office visit needed for further refills.    Requested Prescriptions  Pending Prescriptions Disp Refills   amLODipine (NORVASC) 5 MG tablet [Pharmacy Med Name: AMLODIPINE BESYLATE 5 MG TAB] 90 tablet 0    Sig: Take 1 tablet (5 mg total) by mouth daily. OFFICE VISIT NEEDED FOR ADDITIONAL REFILLS     Cardiovascular: Calcium Channel Blockers 2 Failed - 03/04/2022  2:14 AM      Failed - Valid encounter within last 6 months    Recent Outpatient Visits           9 months ago Tick bite of left back wall of thorax, initial encounter   [place] [facility] Schaumann, Cammie Mcgee, MD   11 months ago Elevated blood sugar   [place] Pickard, Cammie Mcgee, MD   1 year ago Grief reaction   [place] [facility] Schaumann, Cammie Mcgee, MD   1 year ago Otalgia of left ear   [place] Pickard, Cammie Mcgee, MD   1 year ago Routine general medical examination at a health care facility   [place], Cammie Mcgee, MD              Passed - Last BP in normal range    BP Readings from Last 1 Encounters:  05/19/21 120/70         Passed - Last Heart Rate in normal range    Pulse Readings from Last 1 Encounters:  05/19/21 89

## 2022-03-11 ENCOUNTER — Other Ambulatory Visit: Payer: Medicare Other

## 2022-03-11 DIAGNOSIS — E7849 Other hyperlipidemia: Secondary | ICD-10-CM | POA: Diagnosis not present

## 2022-03-11 DIAGNOSIS — I10 Essential (primary) hypertension: Secondary | ICD-10-CM | POA: Diagnosis not present

## 2022-03-11 DIAGNOSIS — D696 Thrombocytopenia, unspecified: Secondary | ICD-10-CM

## 2022-03-11 DIAGNOSIS — K921 Melena: Secondary | ICD-10-CM | POA: Diagnosis not present

## 2022-03-12 LAB — CBC WITH DIFFERENTIAL/PLATELET
Absolute Monocytes: 537 cells/uL (ref 200–950)
Basophils Absolute: 71 cells/uL (ref 0–200)
Basophils Relative: 0.9 %
Eosinophils Absolute: 221 cells/uL (ref 15–500)
Eosinophils Relative: 2.8 %
HCT: 39 % (ref 35.0–45.0)
Hemoglobin: 12.6 g/dL (ref 11.7–15.5)
Lymphs Abs: 3057 cells/uL (ref 850–3900)
MCH: 29.1 pg (ref 27.0–33.0)
MCHC: 32.3 g/dL (ref 32.0–36.0)
MCV: 90.1 fL (ref 80.0–100.0)
MPV: 12.1 fL (ref 7.5–12.5)
Monocytes Relative: 6.8 %
Neutro Abs: 4013 cells/uL (ref 1500–7800)
Neutrophils Relative %: 50.8 %
Platelets: 271 10*3/uL (ref 140–400)
RBC: 4.33 10*6/uL (ref 3.80–5.10)
RDW: 12.1 % (ref 11.0–15.0)
Total Lymphocyte: 38.7 %
WBC: 7.9 10*3/uL (ref 3.8–10.8)

## 2022-03-12 LAB — LIPID PANEL
Cholesterol: 222 mg/dL — ABNORMAL HIGH (ref <200)
HDL: 58 mg/dL (ref >=50)
LDL Cholesterol (Calc): 138 mg/dL (calc) — ABNORMAL HIGH
Non-HDL Cholesterol (Calc): 164 mg/dL (calc) — ABNORMAL HIGH (ref <130)
Total CHOL/HDL Ratio: 3.8 (calc) (ref <5.0)
Triglycerides: 140 mg/dL (ref <150)

## 2022-03-12 LAB — COMPLETE METABOLIC PANEL WITH GFR
AG Ratio: 1.3 (calc) (ref 1.0–2.5)
ALT: 9 U/L (ref 6–29)
AST: 12 U/L (ref 10–35)
Albumin: 3.9 g/dL (ref 3.6–5.1)
Alkaline phosphatase (APISO): 89 U/L (ref 37–153)
BUN: 16 mg/dL (ref 7–25)
CO2: 29 mmol/L (ref 20–32)
Calcium: 10.8 mg/dL — ABNORMAL HIGH (ref 8.6–10.4)
Chloride: 102 mmol/L (ref 98–110)
Creat: 1 mg/dL (ref 0.60–1.00)
Globulin: 3.1 g/dL (calc) (ref 1.9–3.7)
Glucose, Bld: 104 mg/dL — ABNORMAL HIGH (ref 65–99)
Potassium: 4 mmol/L (ref 3.5–5.3)
Sodium: 139 mmol/L (ref 135–146)
Total Bilirubin: 0.4 mg/dL (ref 0.2–1.2)
Total Protein: 7 g/dL (ref 6.1–8.1)
eGFR: 59 mL/min/{1.73_m2} — ABNORMAL LOW (ref >=60)

## 2022-03-15 ENCOUNTER — Ambulatory Visit (INDEPENDENT_AMBULATORY_CARE_PROVIDER_SITE_OTHER): Payer: Medicare Other | Admitting: Family Medicine

## 2022-03-15 ENCOUNTER — Encounter: Payer: Self-pay | Admitting: Family Medicine

## 2022-03-15 VITALS — BP 132/72 | HR 77 | Temp 97.7°F | Ht 66.0 in | Wt 192.0 lb

## 2022-03-15 DIAGNOSIS — I10 Essential (primary) hypertension: Secondary | ICD-10-CM

## 2022-03-15 DIAGNOSIS — Z0000 Encounter for general adult medical examination without abnormal findings: Secondary | ICD-10-CM

## 2022-03-15 DIAGNOSIS — E7849 Other hyperlipidemia: Secondary | ICD-10-CM

## 2022-03-15 DIAGNOSIS — R739 Hyperglycemia, unspecified: Secondary | ICD-10-CM | POA: Diagnosis not present

## 2022-03-15 DIAGNOSIS — Z1211 Encounter for screening for malignant neoplasm of colon: Secondary | ICD-10-CM

## 2022-03-15 DIAGNOSIS — R011 Cardiac murmur, unspecified: Secondary | ICD-10-CM

## 2022-03-15 DIAGNOSIS — Z0001 Encounter for general adult medical examination with abnormal findings: Secondary | ICD-10-CM | POA: Diagnosis not present

## 2022-03-15 MED ORDER — ALPRAZOLAM 0.5 MG PO TABS: 0.5000 mg | ORAL_TABLET | Freq: Three times a day (TID) | ORAL | 0 refills | Status: AC | PRN

## 2022-03-15 NOTE — Progress Notes (Signed)
Subjective:    Patient ID: Kelli Terrell, female    DOB: [birth year], 72 y.o.   MRN: [id number]  HPI Patient is a very pleasant 72 year old Caucasian female here today for complete physical exam.    Her last colonoscopy was in 2019 and was clear.  DEXA 9/22 showed mild osteopenia.  Mammogram was 9/23.  She does have mildly elevated calcium levels that we have supposed were due to her hydrochlorothiazide.  Her liver tests have improved with weight loss as has her sugar.  She denies any chest pain shortness of breath or dyspnea on exertion.  Her blood pressure today is well-controlled.  She continues to deal with neuropathy however she has stopped gabapentin and is taking an over-the-counter supplement which seems to help her more per her opinion.  Her cholesterol is elevated on her labs.  She is due for a shingles vaccine/Shingrix, flu shot, COVID shot, tetanus shot, and an RSV shot Immunization History  Administered Date(s) Administered   Fluad Quad(high Dose 65+) 09/27/2018, 10/04/2019   Influenza, High Dose Seasonal PF 11/30/2017   Influenza,inj,Quad PF,6+ Mos 11/23/2012, 12/20/2013, 11/01/2014, 01/08/2016, 10/21/2016   Influenza-Unspecified 12/21/2010, 10/04/2011   Moderna Sars-Covid-2 Vaccination 05/10/2019, 06/07/2019   Pneumococcal Conjugate-13 12/27/2014   Pneumococcal Polysaccharide-23 12/20/2013   Tdap 12/14/2012   Zoster Recombinat (Shingrix) 02/09/2008    Lab on 03/11/2022  Component Date Value Ref Range Status   WBC 03/11/2022 7.9  3.8 - 10.8 Thousand/uL Final   RBC 03/11/2022 4.33  3.80 - 5.10 Million/uL Final   Hemoglobin 03/11/2022 12.6  11.7 - 15.5 g/dL Final   HCT 03/11/2022 39.0  35.0 - 45.0 % Final   MCV 03/11/2022 90.1  80.0 - 100.0 fL Final   MCH 03/11/2022 29.1  27.0 - 33.0 pg Final   MCHC 03/11/2022 32.3  32.0 - 36.0 g/dL Final   RDW 03/11/2022 12.1  11.0 - 15.0 % Final   Platelets 03/11/2022 271  140 - 400 Thousand/uL Final   MPV 03/11/2022 12.1  7.5 - 12.5 fL  Final   Neutro Abs 03/11/2022 4,013  1,500 - 7,800 cells/uL Final   Lymphs Abs 03/11/2022 3,057  850 - 3,900 cells/uL Final   Absolute Monocytes 03/11/2022 537  200 - 950 cells/uL Final   Eosinophils Absolute 03/11/2022 221  15 - 500 cells/uL Final   Basophils Absolute 03/11/2022 71  0 - 200 cells/uL Final   Neutrophils Relative % 03/11/2022 50.8  % Final   Total Lymphocyte 03/11/2022 38.7  % Final   Monocytes Relative 03/11/2022 6.8  % Final   Eosinophils Relative 03/11/2022 2.8  % Final   Basophils Relative 03/11/2022 0.9  % Final   Glucose, Bld 03/11/2022 104 (H)  65 - 99 mg/dL Final   Comment: .            Fasting reference interval . For someone without known diabetes, a glucose value between 100 and 125 mg/dL is consistent with prediabetes and should be confirmed with a follow-up test. .    BUN 03/11/2022 16  7 - 25 mg/dL Final   Creat 03/11/2022 1.00  0.60 - 1.00 mg/dL Final   eGFR 03/11/2022 59 (L)  > OR = 60 mL/min/1.72m Final   BUN/Creatinine Ratio 03/11/2022 SEE NOTE:  6 - 22 (calc) Final   Comment:    Not Reported: BUN and Creatinine are within    reference range. .    Sodium 03/11/2022 139  135 - 146 mmol/L Final   Potassium 03/11/2022 4.0  3.5 - 5.3 mmol/L Final   Chloride 03/11/2022 102  98 - 110 mmol/L Final   CO2 03/11/2022 29  20 - 32 mmol/L Final   Calcium 03/11/2022 10.8 (H)  8.6 - 10.4 mg/dL Final   Total Protein 03/11/2022 7.0  6.1 - 8.1 g/dL Final   Albumin 03/11/2022 3.9  3.6 - 5.1 g/dL Final   Globulin 03/11/2022 3.1  1.9 - 3.7 g/dL (calc) Final   AG Ratio 03/11/2022 1.3  1.0 - 2.5 (calc) Final   Total Bilirubin 03/11/2022 0.4  0.2 - 1.2 mg/dL Final   Alkaline phosphatase (APISO) 03/11/2022 89  37 - 153 U/L Final   AST 03/11/2022 12  10 - 35 U/L Final   ALT 03/11/2022 9  6 - 29 U/L Final   Cholesterol 03/11/2022 222 (H)  <200 mg/dL Final   HDL 03/11/2022 58  > OR = 50 mg/dL Final   Triglycerides 03/11/2022 140  <150 mg/dL Final   LDL Cholesterol  (Calc) 03/11/2022 138 (H)  mg/dL (calc) Final   Comment: Reference range: <100 . Desirable range <100 mg/dL for primary prevention;   <70 mg/dL for patients with CHD or diabetic patients  with > or = 2 CHD risk factors. Marland Kitchen LDL-C is now calculated using the Martin-Hopkins  calculation, which is a validated novel method providing  better accuracy than the Friedewald equation in the  estimation of LDL-C.  Cresenciano Genre et al. Annamaria Helling. [id number]): 2061-2068  (http://education.[employer].com/faq/FAQ164)    Total CHOL/HDL Ratio 03/11/2022 3.8  <5.0 (calc) Final   Non-HDL Cholesterol (Calc) 03/11/2022 164 (H)  <130 mg/dL (calc) Final   Comment: For patients with diabetes plus 1 major ASCVD risk  factor, treating to a non-HDL-C goal of <100 mg/dL  (LDL-C of <70 mg/dL) is considered a therapeutic  option.     Past Medical History:  Diagnosis Date   AVM (arteriovenous malformation) of colon without hemorrhage    GERD (gastroesophageal reflux disease)    Hyperlipidemia    Hypertension    Internal hemorrhoids    Past Surgical History:  Procedure Laterality Date   CHOLECYSTECTOMY     TONSILLECTOMY     Current Outpatient Medications on File Prior to Visit  Medication Sig Dispense Refill   ALPRAZolam (XANAX) 0.5 MG tablet Take 1 tablet (0.5 mg total) by mouth 3 (three) times daily as needed for anxiety or sleep. 30 tablet 0   amLODipine (NORVASC) 5 MG tablet Take 1 tablet (5 mg total) by mouth daily. 30 tablet 0   Biotin 10 MG TABS Take 10 mg by mouth daily.     Cholecalciferol (VITAMIN D) 50 MCG (2000 UT) tablet Take 2,000 Units by mouth daily.     hydrochlorothiazide (HYDRODIURIL) 25 MG tablet Take 1 tablet (25 mg total) by mouth daily. 90 tablet 3   Krill Oil 500 MG CAPS Take by mouth.     magnesium (MAGTAB) 84 MG (7MEQ) TBCR SR tablet Take 84 mg by mouth.     Multiple Vitamins-Minerals (ICAPS AREDS 2 PO) Take by mouth.     neomycin-polymyxin-hydrocortisone (CORTISPORIN) OTIC  solution Place 3 drops into the left ear 4 (four) times daily. 10 mL 0   omeprazole (PRILOSEC) 40 MG capsule Take 1 capsule (40 mg total) by mouth daily. 90 capsule 2   zinc gluconate 50 MG tablet Take 50 mg by mouth daily.     No current facility-administered medications on file prior to visit.   Allergies  Allergen Reactions   Mobic [Meloxicam]  ITP with platelet count of 1!  Had COVID-19 at the same time.     Codeine Nausea And Vomiting   Social History   Socioeconomic History   Marital status: Married    Spouse name: Not on file   Number of children: Not on file   Years of education: Not on file   Highest education level: Not on file  Occupational History   Not on file  Tobacco Use   Smoking status: Never   Smokeless tobacco: Never  Substance and Sexual Activity   Alcohol use: Yes    Comment: Rare   Drug use: No   Sexual activity: Not on file  Other Topics Concern   Not on file  Social History Narrative   Married. Retired.    Never smoked.    Social Determinants of Health   Financial Resource Strain: Not on file  Food Insecurity: Not on file  Transportation Needs: Not on file  Physical Activity: Not on file  Stress: Not on file  Social Connections: Not on file  Intimate Partner Violence: Not on file   Family History  Problem Relation Age of Onset   Cancer Father        kidney   Stroke Brother    Cancer Brother        Thyroid Cancer   Heart disease Brother      Review of Systems  All other systems reviewed and are negative.      Objective:   Physical Exam Vitals reviewed.  Constitutional:      General: She is not in acute distress.    Appearance: She is well-developed. She is not diaphoretic.  HENT:     Head: Normocephalic and atraumatic.     Right Ear: External ear normal.     Left Ear: External ear normal.     Nose: Nose normal.     Mouth/Throat:     Pharynx: No oropharyngeal exudate.  Eyes:     General: No scleral icterus.        Right eye: No discharge.        Left eye: No discharge.     Conjunctiva/sclera: Conjunctivae normal.     Pupils: Pupils are equal, round, and reactive to light.  Neck:     Thyroid: No thyromegaly.     Vascular: No JVD.     Trachea: No tracheal deviation.  Cardiovascular:     Rate and Rhythm: Normal rate and regular rhythm.     Heart sounds: Normal heart sounds. No murmur heard.    No friction rub. No gallop.  Pulmonary:     Effort: Pulmonary effort is normal. No respiratory distress.     Breath sounds: Normal breath sounds. No stridor. No wheezing or rales.  Chest:     Chest wall: No tenderness.  Abdominal:     General: Bowel sounds are normal. There is no distension.     Palpations: Abdomen is soft. There is no mass.     Tenderness: There is no abdominal tenderness. There is no guarding or rebound.  Musculoskeletal:        General: No tenderness. Normal range of motion.     Cervical back: Normal range of motion and neck supple.  Lymphadenopathy:     Cervical: No cervical adenopathy.  Skin:    General: Skin is warm.     Coloration: Skin is not pale.     Findings: No erythema or rash.  Neurological:     Mental Status: She  is alert and oriented to person, place, and time.     Cranial Nerves: No cranial nerve deficit.     Motor: No abnormal muscle tone.     Coordination: Coordination normal.     Deep Tendon Reflexes: Reflexes are normal and symmetric.  Psychiatric:        Behavior: Behavior normal.        Thought Content: Thought content normal.        Judgment: Judgment normal.       Assessment & Plan:  Routine general medical examination at a health care facility  Essential hypertension  Other hyperlipidemia  Elevated blood sugar  Colon cancer screening - Plan: Cologuard  Cardiac murmur - Plan: ECHOCARDIOGRAM COMPLETE Patient prefers to take fish oil 2000 mg daily to help her cholesterol.  Her blood pressure is excellent.  She is due for colon cancer screening.   Her last colonoscopy wanted a repeat colonoscopy in 5 years.  She declines a colonoscopy but will consent to Cologuard.  She declines a flu shot, tetanus shot, and RSV shot, or COVID booster.  I did recommend shingles vaccine.  Mammogram and bone density are up-to-date.

## 2022-04-02 ENCOUNTER — Other Ambulatory Visit: Payer: Self-pay | Admitting: Family Medicine

## 2022-04-05 NOTE — Telephone Encounter (Signed)
Requested Prescriptions  Pending Prescriptions Disp Refills   amLODipine (NORVASC) 5 MG tablet [Pharmacy Med Name: AMLODIPINE BESYLATE 5 MG TAB] 90 tablet 1    Sig: TAKE 1 TABLET (5 MG TOTAL) BY MOUTH DAILY.     Cardiovascular: Calcium Channel Blockers 2 Failed - 04/02/2022 12:32 PM      Failed - Valid encounter within last 6 months    Recent Outpatient Visits           10 months ago Tick bite of left back wall of thorax, initial encounter   [place] Pickard, Cammie Mcgee, MD   1 year ago Elevated blood sugar   [place] Pickard, Cammie Mcgee, MD   1 year ago Grief reaction   [place] Denn[facility] Schaumann, Cammie Mcgee, MD   1 year ago Otalgia of left ear   [place] Denn[facility] Schaumann Cammie Mcgee, MD   2 years ago Routine general medical examination at a health care facility   [place], Cammie Mcgee, MD              Passed - Last BP in normal range    BP Readings from Last 1 Encounters:  03/15/22 132/72         Passed - Last Heart Rate in normal range    Pulse Readings from Last 1 Encounters:  03/15/22 77

## 2022-04-15 ENCOUNTER — Ambulatory Visit (HOSPITAL_COMMUNITY): Payer: Medicare Other | Attending: Family Medicine

## 2022-04-15 DIAGNOSIS — R011 Cardiac murmur, unspecified: Secondary | ICD-10-CM | POA: Diagnosis not present

## 2022-04-15 LAB — ECHOCARDIOGRAM COMPLETE
AR max vel: 1.8 cm2
AV Area VTI: 1.49 cm2
AV Area mean vel: 1.7 cm2
AV Mean grad: 9 mmHg
AV Peak grad: 15.6 mmHg
AV Vena cont: 0.1 cm
Ao pk vel: 1.98 m/s
Area-P 1/2: 2.73 cm2
Calc EF: 62.9 %
P 1/2 time: 611 msec
S' Lateral: 2.6 cm
Single Plane A2C EF: 63.5 %
Single Plane A4C EF: 61.5 %

## 2022-05-05 DIAGNOSIS — H00025 Hordeolum internum left lower eyelid: Secondary | ICD-10-CM | POA: Diagnosis not present

## 2022-06-14 ENCOUNTER — Other Ambulatory Visit: Payer: Self-pay | Admitting: Family Medicine

## 2022-06-30 ENCOUNTER — Ambulatory Visit
Admission: EM | Admit: 2022-06-30 | Discharge: 2022-06-30 | Disposition: A | Discharge status: Home / Self Care | Payer: Medicare Other | Attending: Family Medicine | Admitting: Family Medicine

## 2022-06-30 ENCOUNTER — Other Ambulatory Visit: Payer: Self-pay

## 2022-06-30 ENCOUNTER — Encounter: Payer: Self-pay | Admitting: Emergency Medicine

## 2022-06-30 DIAGNOSIS — R1032 Left lower quadrant pain: Secondary | ICD-10-CM

## 2022-06-30 LAB — POCT URINALYSIS DIP (MANUAL ENTRY)
Bilirubin, UA: NEGATIVE
Glucose, UA: NEGATIVE mg/dL
Ketones, POC UA: NEGATIVE mg/dL
Leukocytes, UA: NEGATIVE
Nitrite, UA: NEGATIVE
Protein Ur, POC: NEGATIVE mg/dL
Spec Grav, UA: >=1.03 — AB (ref 1.010–1.025)
Urobilinogen, UA: 0.2 E.U./dL
pH, UA: 5.5 (ref 5.0–8.0)

## 2022-06-30 MED ORDER — HYDROCODONE-ACETAMINOPHEN 5-325 MG PO TABS: 1.0000 | ORAL_TABLET | Freq: Four times a day (QID) | ORAL | 0 refills | Status: DC | PRN

## 2022-06-30 NOTE — ED Provider Notes (Signed)
[facility]   [id number] 06/30/22 Arrival Time: 1321  ASSESSMENT & PLAN:  1. Left groin pain    Hopefully this will calm down over the next week. If not, recommend orthopaedic f/u. No hernia appreciated on exam.  New Prescriptions   HYDROCODONE-ACETAMINOPHEN (NORCO/VICODIN) 5-325 MG TABLET    Take 1 tablet by mouth every 6 (six) hours as needed for severe pain.    Orders Placed This Encounter  Procedures   POCT urinalysis dipstick   Results for orders placed or performed during the hospital encounter of 06/30/22  POCT urinalysis dipstick  Result Value Ref Range   Color, UA yellow yellow   Clarity, UA clear clear   Glucose, UA negative negative mg/dL   Bilirubin, UA negative negative   Ketones, POC UA negative negative mg/dL   Spec Grav, UA >=4.098 (A) 1.010 - 1.025   Blood, UA trace-intact (A) negative   pH, UA 5.5 5.0 - 8.0   Protein Ur, POC negative negative mg/dL   Urobilinogen, UA 0.2 0.2 or 1.0 E.U./dL   Nitrite, UA Negative Negative   Leukocytes, UA Negative Negative   Recommend:  Follow-up Information     [facility].   Specialty: Orthopedics Why: If worsening or failing to improve as anticipated. Contact information: [address] [place] [address] [phone]        [facility].   Specialty: Specialist Contact information: [address] [place] [address] [phone]                 [place] Controlled Substances Registry consulted for this patient. I feel the risk/benefit ratio today is favorable for proceeding with this prescription for a controlled substance. Medication sedation precautions given.  Reviewed expectations re: course of current medical issues. Questions answered. Outlined signs and symptoms indicating need for more acute intervention. Patient verbalized understanding. After Visit Summary given.  SUBJECTIVE: History from: patient. Kelli Terrell is a 72 y.o. female who reports mowing on an incline and reports mower tilting requiring her to fight against it; "felt something pull in my belly on the left near my groin"; one week ago; Continued pain of anterior upper left leg and of left groin. Pain intensifies with bowel movements. No blood in stool. No difficulties with urination. Tylenol without much relief.  Past Surgical History:  Procedure Laterality Date   CHOLECYSTECTOMY     TONSILLECTOMY        OBJECTIVE:  Vitals:   06/30/22 1330  BP: 135/69  Pulse: 71  Resp: 20  Temp: (!) 97.4 F (36.3 C)  TempSrc: Oral  SpO2: 98%    General appearance: alert; no distress HEENT: [place]; AT Neck: supple with FROM Resp: unlabored respirations Extremities: LLE: warm with well perfused appearance; poorly localized mild to moderate tenderness over left inner thigh toward groin; without gross deformities; swelling: none; bruising: none; hip ROM: normal CV: brisk extremity capillary refill of LLE; 2+ DP pulse of LLE. Skin: warm and dry; no visible rashes Neurologic: gait normal; normal sensation and strength of LLE Psychological: alert and cooperative; normal mood and affect   Allergies  Allergen Reactions   Mobic [Meloxicam]     ITP with platelet count of 1!  Had COVID-19 at the same time.     Codeine Nausea And Vomiting    Past Medical History:  Diagnosis Date   AVM (arteriovenous malformation) of colon without hemorrhage    GERD (gastroesophageal reflux disease)  Hyperlipidemia    Hypertension    Internal hemorrhoids    Social History   Socioeconomic History   Marital status: Married    Spouse name: Not on file   Number of children: Not on file   Years of education: Not on file   Highest education level: Not on file  Occupational History   Not on file  Tobacco Use   Smoking status: Never   Smokeless tobacco: Never  Substance and Sexual Activity   Alcohol use: Yes    Comment: Rare   Drug use: No   Sexual  activity: Not on file  Other Topics Concern   Not on file  Social History Narrative   Married. Retired.    Never smoked.    Social Determinants of Health   Financial Resource Strain: Not on file  Food Insecurity: Not on file  Transportation Needs: Not on file  Physical Activity: Not on file  Stress: Not on file  Social Connections: Not on file   Family History  Problem Relation Age of Onset   Cancer Father        kidney   Stroke Brother    Cancer Brother        Thyroid Cancer   Heart disease Brother    Past Surgical History:  Procedure Laterality Date   CHOLECYSTECTOMY     TONSILLECTOMY         Mardella Layman, MD 06/30/22 1444

## 2022-06-30 NOTE — Discharge Instructions (Addendum)

## 2022-06-30 NOTE — ED Triage Notes (Signed)
Pt reports was mowing on an incline and reports mower tilted and states "felt something pull in my belly" since Wednesday. Pt reports left lower back pain that radiates to left groin and left leg. Pt reports pain intensifies if have to void.

## 2022-07-12 DIAGNOSIS — M545 Low back pain, unspecified: Secondary | ICD-10-CM | POA: Diagnosis not present

## 2022-07-12 DIAGNOSIS — M25552 Pain in left hip: Secondary | ICD-10-CM | POA: Diagnosis not present

## 2022-07-13 ENCOUNTER — Ambulatory Visit: Payer: Medicare Other | Admitting: Family Medicine

## 2022-07-13 DIAGNOSIS — M1612 Unilateral primary osteoarthritis, left hip: Secondary | ICD-10-CM | POA: Diagnosis not present

## 2022-07-26 ENCOUNTER — Ambulatory Visit (INDEPENDENT_AMBULATORY_CARE_PROVIDER_SITE_OTHER): Payer: Medicare Other | Admitting: Family Medicine

## 2022-07-26 ENCOUNTER — Encounter: Payer: Self-pay | Admitting: Family Medicine

## 2022-07-26 VITALS — BP 120/72 | HR 73 | Temp 97.6°F | Ht 66.0 in | Wt 179.0 lb

## 2022-07-26 DIAGNOSIS — M25552 Pain in left hip: Secondary | ICD-10-CM | POA: Diagnosis not present

## 2022-07-26 MED ORDER — CYCLOBENZAPRINE HCL 10 MG PO TABS: 10.0000 mg | ORAL_TABLET | Freq: Three times a day (TID) | ORAL | 0 refills | Status: DC | PRN

## 2022-07-26 MED ORDER — OXYCODONE-ACETAMINOPHEN 7.5-325 MG PO TABS: 1.0000 | ORAL_TABLET | ORAL | 0 refills | Status: DC | PRN

## 2022-07-26 NOTE — Progress Notes (Signed)
Subjective:    Patient ID: Kelli Terrell, female    DOB: 10-15-1950, 72 y.o.   MRN: [id number]  Groin Pain     Patient presents today complaining of severe pain in her left leg.  On June 11, she hyperextended her left hip trying to keep her lawn more from flipping on a uneven surface.  Afterwards she developed severe anterior left hip pain.  The pain was so severe that she went to an urgent care.  They gave her pain medication and told her that she had strained a muscle in her groin.  She then went to an orthopedic urgent care where they obtained an x-ray of her left hip and saw a "bone spur".  She came back the next day for a cortisone injection to the left hip.  This needs the pain in her left hip however after 1 week the pain returned.  She now complains of moderate to severe pain in her left anterior hip.  Today passively I flexed her left hip and did not elicit any pain.  Internal and external rotation did not elicit any pain.  I then had the patient fully extend her left leg.  I asked her to flex her hip and raise her left leg off the exam table.  She was unable to lift the left leg even against gravity indicating 1-2 out of 5 muscle strength in the hip flexor.  She was unable to withstand any resistance and this elicited severe pain to the point the patient became tearful and hyperventilated.  She is also tender to palpation all around the inguinal canal and the anterior left hip however I do not appreciate any lymphadenopathy or hernia.  She has normal bowel sounds.  She does complain with pain with defecation and pain with urination.  However I do not appreciate any palpable abnormality in the left inguinal canal  Past Medical History:  Diagnosis Date   AVM (arteriovenous malformation) of colon without hemorrhage    GERD (gastroesophageal reflux disease)    Hyperlipidemia    Hypertension    Internal hemorrhoids    Past Surgical History:  Procedure Laterality Date   CHOLECYSTECTOMY      TONSILLECTOMY     Current Outpatient Medications on File Prior to Visit  Medication Sig Dispense Refill   ALPRAZolam (XANAX) 0.5 MG tablet Take 1 tablet (0.5 mg total) by mouth 3 (three) times daily as needed for anxiety or sleep. 30 tablet 0   amLODipine (NORVASC) 5 MG tablet TAKE 1 TABLET (5 MG TOTAL) BY MOUTH DAILY. 90 tablet 1   hydrochlorothiazide (HYDRODIURIL) 25 MG tablet Take 1 tablet (25 mg total) by mouth daily. 90 tablet 3   Krill Oil 500 MG CAPS Take by mouth.     Multiple Vitamins-Minerals (ICAPS AREDS 2 PO) Take by mouth.     omeprazole (PRILOSEC) 40 MG capsule Take 1 capsule (40 mg total) by mouth daily. 90 capsule 2   Biotin 10 MG TABS Take 10 mg by mouth daily. (Patient not taking: Reported on 07/26/2022)     Cholecalciferol (VITAMIN D) 50 MCG (2000 UT) tablet Take 2,000 Units by mouth daily. (Patient not taking: Reported on 07/26/2022)     HYDROcodone-acetaminophen (NORCO/VICODIN) 5-325 MG tablet Take 1 tablet by mouth every 6 (six) hours as needed for severe pain. (Patient not taking: Reported on 07/26/2022) 15 tablet 0   magnesium (MAGTAB) 84 MG ( ) TBCR SR tablet Take 84 mg by mouth. (Patient not taking: Reported on  07/26/2022)     zinc gluconate 50 MG tablet Take 50 mg by mouth daily. (Patient not taking: Reported on 07/26/2022)     No current facility-administered medications on file prior to visit.   Allergies  Allergen Reactions   Mobic [Meloxicam]     ITP with platelet count of 1!  Had COVID-19 at the same time.     Codeine Nausea And Vomiting   Social History   Socioeconomic History   Marital status: Married    Spouse name: Not on file   Number of children: Not on file   Years of education: Not on file   Highest education level: Not on file  Occupational History   Not on file  Tobacco Use   Smoking status: Never   Smokeless tobacco: Never  Substance and Sexual Activity   Alcohol use: Yes    Comment: Rare   Drug use: No   Sexual activity: Not on file   Other Topics Concern   Not on file  Social History Narrative   Married. Retired.    Never smoked.    Social Determinants of Health   Financial Resource Strain: Not on file  Food Insecurity: Not on file  Transportation Needs: Not on file  Physical Activity: Not on file  Stress: Not on file  Social Connections: Not on file  Intimate Partner Violence: Not on file   Family History  Problem Relation Age of Onset   Cancer Father        kidney   Stroke Brother    Cancer Brother        Thyroid Cancer   Heart disease Brother      Review of Systems  All other systems reviewed and are negative.      Objective:   Physical Exam Vitals reviewed.  Constitutional:      General: She is not in acute distress.    Appearance: She is well-developed. She is not diaphoretic.  HENT:     Head: Normocephalic and atraumatic.     Right Ear: External ear normal.     Left Ear: External ear normal.     Nose: Nose normal.     Mouth/Throat:     Pharynx: No oropharyngeal exudate.  Eyes:     General: No scleral icterus.       Right eye: No discharge.        Left eye: No discharge.     Conjunctiva/sclera: Conjunctivae normal.     Pupils: Pupils are equal, round, and reactive to light.  Neck:     Thyroid: No thyromegaly.     Vascular: No JVD.     Trachea: No tracheal deviation.  Cardiovascular:     Rate and Rhythm: Normal rate and regular rhythm.     Heart sounds: Normal heart sounds. No murmur heard.    No friction rub. No gallop.  Pulmonary:     Effort: Pulmonary effort is normal. No respiratory distress.     Breath sounds: Normal breath sounds. No stridor. No wheezing or rales.  Chest:     Chest wall: No tenderness.  Abdominal:     General: Bowel sounds are normal. There is no distension.     Palpations: Abdomen is soft. There is no mass.     Tenderness: There is no abdominal tenderness. There is no guarding or rebound.  Musculoskeletal:        General: Normal range of motion.      Cervical back: Normal range of motion and  neck supple.     Left hip: Tenderness present. Normal range of motion. Decreased strength.       Legs:  Lymphadenopathy:     Cervical: No cervical adenopathy.  Skin:    General: Skin is warm.     Coloration: Skin is not pale.     Findings: No erythema or rash.  Neurological:     Mental Status: She is alert and oriented to person, place, and time.     Cranial Nerves: No cranial nerve deficit.     Motor: No abnormal muscle tone.     Coordination: Coordination normal.     Deep Tendon Reflexes: Reflexes are normal and symmetric.  Psychiatric:        Behavior: Behavior normal.        Thought Content: Thought content normal.        Judgment: Judgment normal.        Assessment & Plan:  Acute hip pain, left Given that passive range of motion in the left hip does not cause any pain, but challenging the iliopsoas muscle recreates the pain, I suspect the patient has strained or torn her hip flexor.  I am uncertain why the cortisone injection helped.  It is also possible that this could be intra-articular.  However her exam today supports more of a muscle tear rather than a hip joint issue.  I do not appreciate any inguinal hernia.  I have recommended that she follow back up with her orthopedist to determine if they need to get an MRI of the hip joint/pelvis to determine further the next step of action.  I recommended using Flexeril 10 mg every 8 hours for what I suspect is a torn hip flexor.  She can also use Percocet sparingly for breakthrough pain.  She is going to contact orthopedist and then let me know what the neck step is.

## 2022-07-28 DIAGNOSIS — M25552 Pain in left hip: Secondary | ICD-10-CM | POA: Diagnosis not present

## 2022-07-28 DIAGNOSIS — M545 Low back pain, unspecified: Secondary | ICD-10-CM | POA: Diagnosis not present

## 2022-07-29 DIAGNOSIS — M25552 Pain in left hip: Secondary | ICD-10-CM | POA: Diagnosis not present

## 2022-07-29 DIAGNOSIS — M545 Low back pain, unspecified: Secondary | ICD-10-CM | POA: Diagnosis not present

## 2022-08-06 DIAGNOSIS — M25552 Pain in left hip: Secondary | ICD-10-CM | POA: Diagnosis not present

## 2022-08-09 ENCOUNTER — Other Ambulatory Visit: Payer: Self-pay | Admitting: Family Medicine

## 2022-08-09 ENCOUNTER — Telehealth: Payer: Self-pay | Admitting: Family Medicine

## 2022-08-09 DIAGNOSIS — N9489 Other specified conditions associated with female genital organs and menstrual cycle: Secondary | ICD-10-CM

## 2022-08-09 NOTE — Telephone Encounter (Signed)
Patient brought the results by from her MRI. The results are on providers desk. She states that it shows she has a cyst the size of a golf ball she was told that she needed to see a gynecologist and was referred to Dr. Jerene Bears; however she isn't taking new patients. Patient would like you to recommend someone.

## 2022-08-11 DIAGNOSIS — M25552 Pain in left hip: Secondary | ICD-10-CM | POA: Diagnosis not present

## 2022-08-24 DIAGNOSIS — M25552 Pain in left hip: Secondary | ICD-10-CM | POA: Diagnosis not present

## 2022-09-20 ENCOUNTER — Other Ambulatory Visit: Payer: Self-pay | Admitting: Family Medicine

## 2022-09-21 NOTE — Telephone Encounter (Signed)
Requested Prescriptions  Refused Prescriptions Disp Refills   hydrochlorothiazide (HYDRODIURIL) 25 MG tablet [Pharmacy Med Name: HYDROCHLOROTHIAZIDE 25 MG TAB] 90 tablet 3    Sig: TAKE 1 TABLET (25 MG TOTAL) BY MOUTH DAILY.     Cardiovascular: Diuretics - Thiazide Failed - 09/20/2022  2:09 PM      Failed - Cr in normal range and within 180 days    Creat  Date Value Ref Range Status  03/11/2022 1.00 0.60 - 1.00 mg/dL Final         Failed - K in normal range and within 180 days    Potassium  Date Value Ref Range Status  03/11/2022 4.0 3.5 - 5.3 mmol/L Final         Failed - Na in normal range and within 180 days    Sodium  Date Value Ref Range Status  03/11/2022 139 135 - 146 mmol/L Final         Failed - Valid encounter within last 6 months    Recent Outpatient Visits           1 year ago Tick bite of left back wall of thorax, initial encounter   [facility] Pickard, Priscille Heidelberg, MD   1 year ago Elevated blood sugar   [facility] Pickard, Priscille Heidelberg, MD   1 year ago Grief reaction   [facility] Tanya Nones, Priscille Heidelberg, MD   2 years ago Otalgia of left ear   [facility] Tanya Nones, Priscille Heidelberg, MD   2 years ago Routine general medical examination at a health care facility   [facility] Pickard, Priscille Heidelberg, MD              Passed - Last BP in normal range    BP Readings from Last 1 Encounters:  07/26/22 120/72

## 2022-09-23 ENCOUNTER — Ambulatory Visit (INDEPENDENT_AMBULATORY_CARE_PROVIDER_SITE_OTHER): Payer: Medicare Other

## 2022-09-23 VITALS — Ht 66.0 in | Wt 179.0 lb

## 2022-09-23 DIAGNOSIS — Z0000 Encounter for general adult medical examination without abnormal findings: Secondary | ICD-10-CM | POA: Diagnosis not present

## 2022-09-23 NOTE — Progress Notes (Signed)
Subjective:   Kelli Terrell is a 72 y.o. female who presents for Medicare Annual (Subsequent) preventive examination.  Visit Complete: Virtual  I connected with  Kelli Terrell on 09/23/22 by a audio enabled telemedicine application and verified that I am speaking with the correct person using two identifiers.  Patient Location: Home  Provider Location: Home Office  I discussed the limitations of evaluation and management by telemedicine. The patient expressed understanding and agreed to proceed.  Vital Signs: Because this visit was a virtual/telehealth visit, some criteria may be missing or patient reported. Any vitals not documented were not able to be obtained and vitals that have been documented are patient reported.   Review of Systems     Cardiac Risk Factors include: advanced age (>34men, >44 women);dyslipidemia;hypertension     Objective:    Today's Vitals   09/23/22 1400  Weight: 179 lb (81.2 kg)  Height: 5\' 6"  (1.676 m)   Body mass index is 28.89 kg/m.     09/23/2022    2:24 PM 02/05/2019    2:11 PM  Advanced Directives  Does Patient Have a Medical Advance Directive? No No  Would patient like information on creating a medical advance directive? Yes (MAU/Ambulatory/Procedural Areas - Information given) Yes (ED - Information included in AVS)    Current Medications (verified) Outpatient Encounter Medications as of 09/23/2022  Medication Sig   ALPRAZolam (XANAX) 0.5 MG tablet Take 1 tablet (0.5 mg total) by mouth 3 (three) times daily as needed for anxiety or sleep.   amLODipine (NORVASC) 5 MG tablet TAKE 1 TABLET (5 MG TOTAL) BY MOUTH DAILY.   cyclobenzaprine (FLEXERIL) 10 MG tablet Take 1 tablet (10 mg total) by mouth 3 (three) times daily as needed for muscle spasms.   hydrochlorothiazide (HYDRODIURIL) 25 MG tablet Take 1 tablet (25 mg total) by mouth daily.   HYDROcodone-acetaminophen (NORCO/VICODIN) 5-325 MG tablet Take 1 tablet by mouth every 6 (six) hours as  needed for severe pain.   Krill Oil 500 MG CAPS Take by mouth.   magnesium (MAGTAB) 84 MG ( ) TBCR SR tablet Take 84 mg by mouth.   Multiple Vitamins-Minerals (ICAPS AREDS 2 PO) Take by mouth.   omeprazole (PRILOSEC) 40 MG capsule Take 1 capsule (40 mg total) by mouth daily.   oxyCODONE-acetaminophen (PERCOCET) 7.5-325 MG tablet Take 1 tablet by mouth every 4 (four) hours as needed for severe pain.   zinc gluconate 50 MG tablet Take 50 mg by mouth daily.   Biotin 10 MG TABS Take 10 mg by mouth daily. (Patient not taking: Reported on 09/23/2022)   Cholecalciferol (VITAMIN D) 50 MCG (2000 UT) tablet Take 2,000 Units by mouth daily. (Patient not taking: Reported on 09/23/2022)   No facility-administered encounter medications on file as of 09/23/2022.    Allergies (verified) Mobic [meloxicam] and Codeine   History: Past Medical History:  Diagnosis Date   AVM (arteriovenous malformation) of colon without hemorrhage    GERD (gastroesophageal reflux disease)    Hyperlipidemia    Hypertension    Internal hemorrhoids    Past Surgical History:  Procedure Laterality Date   CHOLECYSTECTOMY     TONSILLECTOMY     Family History  Problem Relation Age of Onset   Cancer Father        kidney   Stroke Brother    Cancer Brother        Thyroid Cancer   Heart disease Brother    Social History   Socioeconomic History  Marital status: Married    Spouse name: Not on file   Number of children: Not on file   Years of education: Not on file   Highest education level: Not on file  Occupational History   Not on file  Tobacco Use   Smoking status: Never   Smokeless tobacco: Never  Substance and Sexual Activity   Alcohol use: Yes    Comment: Rare   Drug use: No   Sexual activity: Not on file  Other Topics Concern   Not on file  Social History Narrative   Married. Retired.    Never smoked.    Social Determinants of Health   Financial Resource Strain: Low Risk  (09/23/2022)   Overall  Financial Resource Strain (CARDIA)    Difficulty of Paying Living Expenses: Not hard at all  Food Insecurity: No Food Insecurity (09/23/2022)   Hunger Vital Sign    Worried About Running Out of Food in the Last Year: Never true    Ran Out of Food in the Last Year: Never true  Transportation Needs: No Transportation Needs (09/23/2022)   PRAPARE - Administrator, Civil Service (Medical): No    Lack of Transportation (Non-Medical): No  Physical Activity: Insufficiently Active (09/23/2022)   Exercise Vital Sign    Days of Exercise per Week: 3 days    Minutes of Exercise per Session: 30 min  Stress: No Stress Concern Present (09/23/2022)   [employer] of Occupational Health - Occupational Stress Questionnaire    Feeling of Stress : Not at all  Social Connections: Moderately Integrated (09/23/2022)   Social Connection and Isolation Panel [NHANES]    Frequency of Communication with Friends and Family: More than three times a week    Frequency of Social Gatherings with Friends and Family: Three times a week    Attends Religious Services: More than 4 times per year    Active Member of Clubs or Organizations: No    Attends Banker Meetings: Never    Marital Status: Married    Tobacco Counseling Counseling given: Not Answered   Clinical Intake:  Pre-visit preparation completed: Yes  Pain : No/denies pain     Diabetes: No  How often do you need to have someone help you when you read instructions, pamphlets, or other written materials from your doctor or pharmacy?: 1 - Never  Interpreter Needed?: No  Information entered by :: Kandis Fantasia LPN   Activities of Daily Living    09/23/2022    2:24 PM  In your present state of health, do you have any difficulty performing the following activities:  Hearing? 0  Vision? 0  Difficulty concentrating or making decisions? 0  Walking or climbing stairs? 0  Dressing or bathing? 0  Doing errands, shopping? 0   Preparing Food and eating ? N  Using the Toilet? N  In the past six months, have you accidently leaked urine? N  Do you have problems with loss of bowel control? N  Managing your Medications? N  Managing your Finances? N  Housekeeping or managing your Housekeeping? N    Patient Care Team: Donita Brooks, MD as PCP - General (Family Medicine) Fredrich Birks, OD as Referring Physician (Optometry) Aris Lot, MD as Consulting Physician (Dermatology) Associates, [facility] Ob[facility]Gyn Mammography, [facility] (Diagnostic Radiology)  Indicate any recent Medical Services you may have received from other than [facility] providers in the past year (date may be approximate).     Assessment:   This  is a routine wellness examination for Dorathy.  Hearing/Vision screen Hearing Screening - Comments:: Denies hearing difficulties   Vision Screening - Comments:: Wears rx glasses - up to date with routine eye exams with Dr. Fredrich Birks     Goals Addressed             This Visit's Progress    Remain active and independent        Depression Screen    09/23/2022    2:23 PM 03/15/2022    9:33 AM 03/12/2021    8:35 AM 03/11/2020    8:40 AM 01/25/2019    9:30 AM 01/20/2018   10:34 AM 09/14/2017   11:45 AM  PHQ 2/9 Scores  PHQ - 2 Score 0 0 1 0 0 0 0  PHQ- 9 Score   2        Fall Risk    09/23/2022    2:24 PM 03/15/2022    9:33 AM 03/12/2021    8:34 AM 03/11/2020    8:40 AM 01/25/2019    9:30 AM  Fall Risk   Falls in the past year? 0 0 0 0 0  Number falls in past yr: 0 0 0 0   Injury with Fall? 0 0 0 0   Risk for fall due to : No Fall Risks No Fall Risks  No Fall Risks   Follow up Falls prevention discussed;Education provided;Falls evaluation completed Falls prevention discussed  Falls evaluation completed Falls evaluation completed    MEDICARE RISK AT HOME: Medicare Risk at Home Any stairs in or around the home?: No If so, are there any without handrails?: No Home free of loose throw rugs in walkways,  pet beds, electrical cords, etc?: Yes Adequate lighting in your home to reduce risk of falls?: Yes Life alert?: No Use of a cane, walker or w/c?: No Grab bars in the bathroom?: Yes Shower chair or bench in shower?: No Elevated toilet seat or a handicapped toilet?: No  TIMED UP AND GO:  Was the test performed?  No    Cognitive Function:        09/23/2022    2:24 PM  6CIT Screen  What Year? 0 points  What month? 0 points  What time? 0 points  Count back from 20 0 points  Months in reverse 0 points  Repeat phrase 0 points  Total Score 0 points    Immunizations Immunization History  Administered Date(s) Administered   Fluad Quad(high Dose 65+) 09/27/2018, 10/04/2019   Influenza, High Dose Seasonal PF 11/30/2017   Influenza,inj,Quad PF,6+ Mos 11/23/2012, 12/20/2013, 11/01/2014, 01/08/2016, 10/21/2016   Influenza-Unspecified 12/21/2010, 10/04/2011   Moderna Sars-Covid-2 Vaccination 05/10/2019, 06/07/2019   Pneumococcal Conjugate-13 12/27/2014   Pneumococcal Polysaccharide-23 12/20/2013   Tdap 12/14/2012   Zoster Recombinant(Shingrix) 02/09/2008    TDAP status: Up to date  Flu Vaccine status: Due, Education has been provided regarding the importance of this vaccine. Advised may receive this vaccine at local pharmacy or Health Dept. Aware to provide a copy of the vaccination record if obtained from local pharmacy or Health Dept. Verbalized acceptance and understanding.  Pneumococcal vaccine status: Up to date  Covid-19 vaccine status: Information provided on how to obtain vaccines.   Qualifies for Shingles Vaccine? Yes   Zostavax completed Yes   Shingrix Completed?: No.    Education has been provided regarding the importance of this vaccine. Patient has been advised to call insurance company to determine out of pocket expense if they have not yet  received this vaccine. Advised may also receive vaccine at local pharmacy or Health Dept. Verbalized acceptance and  understanding.  Screening Tests Health Maintenance  Topic Date Due   Zoster Vaccines- Shingrix (2 of 2) 04/05/2008   INFLUENZA VACCINE  08/12/2022   COVID-19 Vaccine (3 - 2023-24 season) 09/12/2022   DTaP/Tdap/Td (2 - Td or Tdap) 12/15/2022   Medicare Annual Wellness (AWV)  09/23/2023   MAMMOGRAM  09/29/2023   Colonoscopy  07/13/2026   Pneumonia Vaccine 53+ Years old  Completed   DEXA SCAN  Completed   Hepatitis C Screening  Completed   HPV VACCINES  Aged Out    Health Maintenance  Health Maintenance Due  Topic Date Due   Zoster Vaccines- Shingrix (2 of 2) 04/05/2008   INFLUENZA VACCINE  08/12/2022   COVID-19 Vaccine (3 - 2023-24 season) 09/12/2022    Colorectal cancer screening: Type of screening: Colonoscopy. Completed 07/12/16. Repeat every 10 years  Mammogram status: Completed 09/28/21. Repeat every year  Bone Density status: Completed 09/22/20. Results reflect: Bone density results: NORMAL. Repeat every 5 years.  Lung Cancer Screening: (Low Dose CT Chest recommended if Age 67-80 years, 20 pack-year currently smoking OR have quit w/in 15years.) does not qualify.   Lung Cancer Screening Referral: n/a  Additional Screening:  Hepatitis C Screening: does qualify; Completed 03/04/20  Vision Screening: Recommended annual ophthalmology exams for early detection of glaucoma and other disorders of the eye. Is the patient up to date with their annual eye exam?  Yes  Who is the provider or what is the name of the office in which the patient attends annual eye exams? Dr. Fredrich Birks  If pt is not established with a provider, would they like to be referred to a provider to establish care? No .   Dental Screening: Recommended annual dental exams for proper oral hygiene  Community Resource Referral / Chronic Care Management: CRR required this visit?  No   CCM required this visit?  No     Plan:     I have personally reviewed and noted the following in the patient's chart:    Medical and social history Use of alcohol, tobacco or illicit drugs  Current medications and supplements including opioid prescriptions. Patient is not currently taking opioid prescriptions. Functional ability and status Nutritional status Physical activity Advanced directives List of other physicians Hospitalizations, surgeries, and ER visits in previous 12 months Vitals Screenings to include cognitive, depression, and falls Referrals and appointments  In addition, I have reviewed and discussed with patient certain preventive protocols, quality metrics, and best practice recommendations. A written personalized care plan for preventive services as well as general preventive health recommendations were provided to patient.     Kandis Fantasia [place], [place]   1/61/0960   After Visit Summary: (MyChart) Due to this being a telephonic visit, the after visit summary with patients personalized plan was offered to patient via MyChart   Nurse Notes: No concerns at this time

## 2022-09-23 NOTE — Patient Instructions (Signed)
Ms. Gausman , Thank you for taking time to come for your Medicare Wellness Visit. I appreciate your ongoing commitment to your health goals. Please review the following plan we discussed and let me know if I can assist you in the future.   Referrals/Orders/Follow-Ups/Clinician Recommendations: Aim for 30 minutes of exercise or brisk walking, 6-8 glasses of water, and 5 servings of fruits and vegetables each day.  This is a list of the screening recommended for you and due dates:  Health Maintenance  Topic Date Due   Zoster (Shingles) Vaccine (2 of 2) 04/05/2008   Flu Shot  08/12/2022   COVID-19 Vaccine (3 - 2023-24 season) 09/12/2022   DTaP/Tdap/Td vaccine (2 - Td or Tdap) 12/15/2022   Medicare Annual Wellness Visit  09/23/2023   Mammogram  09/29/2023   Colon Cancer Screening  07/13/2026   Pneumonia Vaccine  Completed   DEXA scan (bone density measurement)  Completed   Hepatitis C Screening  Completed   HPV Vaccine  Aged Out    Advanced directives: (ACP Link)Information on Advanced Care Planning can be found at [facility] [place] Advance Health Care Directives Advance Health Care Directives ([email or web address]   Next Medicare Annual Wellness Visit scheduled for next year: Yes

## 2022-09-29 DIAGNOSIS — H2513 Age-related nuclear cataract, bilateral: Secondary | ICD-10-CM | POA: Diagnosis not present

## 2022-09-30 NOTE — Telephone Encounter (Signed)
Patient called to advise provider her pharmacy accidentally gave her someone else her medication and gave hers to someone else; stated she didn't realize it until she got home. Patient stated the pharmacy told her she doesn't have any more refills on file and is requiring a new script; Patient stated the pharmacy documented her script in their system under a different patient's file.   Pharmacy:   [id number] - [place], [place] - [address] AT [facility] [address], [place] [address] Phone: [phone]  Fax: [phone] DEA #: [id number]   Please advise patient at [phone].

## 2022-10-04 DIAGNOSIS — Z1231 Encounter for screening mammogram for malignant neoplasm of breast: Secondary | ICD-10-CM | POA: Diagnosis not present

## 2022-10-04 LAB — HM MAMMOGRAPHY

## 2022-10-05 DIAGNOSIS — C44311 Basal cell carcinoma of skin of nose: Secondary | ICD-10-CM | POA: Diagnosis not present

## 2022-10-05 DIAGNOSIS — L814 Other melanin hyperpigmentation: Secondary | ICD-10-CM | POA: Diagnosis not present

## 2022-10-05 DIAGNOSIS — D485 Neoplasm of uncertain behavior of skin: Secondary | ICD-10-CM | POA: Diagnosis not present

## 2022-10-05 DIAGNOSIS — C44319 Basal cell carcinoma of skin of other parts of face: Secondary | ICD-10-CM | POA: Diagnosis not present

## 2022-10-05 DIAGNOSIS — D225 Melanocytic nevi of trunk: Secondary | ICD-10-CM | POA: Diagnosis not present

## 2022-10-05 DIAGNOSIS — D4819 Other specified neoplasm of uncertain behavior of connective and other soft tissue: Secondary | ICD-10-CM | POA: Diagnosis not present

## 2022-10-05 DIAGNOSIS — L821 Other seborrheic keratosis: Secondary | ICD-10-CM | POA: Diagnosis not present

## 2022-10-05 DIAGNOSIS — D1801 Hemangioma of skin and subcutaneous tissue: Secondary | ICD-10-CM | POA: Diagnosis not present

## 2022-10-05 DIAGNOSIS — Z85828 Personal history of other malignant neoplasm of skin: Secondary | ICD-10-CM | POA: Diagnosis not present

## 2022-10-05 DIAGNOSIS — L570 Actinic keratosis: Secondary | ICD-10-CM | POA: Diagnosis not present

## 2022-10-07 ENCOUNTER — Other Ambulatory Visit: Payer: Self-pay | Admitting: Family Medicine

## 2022-10-07 NOTE — Telephone Encounter (Signed)
Requested Prescriptions  Pending Prescriptions Disp Refills   amLODipine (NORVASC) 5 MG tablet [Pharmacy Med Name: AMLODIPINE BESYLATE 5 MG TAB] 90 tablet 1    Sig: TAKE 1 TABLET (5 MG TOTAL) BY MOUTH DAILY.     Cardiovascular: Calcium Channel Blockers 2 Failed - 10/07/2022  2:38 AM      Failed - Valid encounter within last 6 months    Recent Outpatient Visits           1 year ago Tick bite of left back wall of thorax, initial encounter   [facility] Pickard, Priscille Heidelberg, MD   1 year ago Elevated blood sugar   [facility] Tanya Nones, Priscille Heidelberg, MD   2 years ago Grief reaction   [facility] Tanya Nones, Priscille Heidelberg, MD   2 years ago Otalgia of left ear   [facility] Tanya Nones, Priscille Heidelberg, MD   2 years ago Routine general medical examination at a health care facility   [facility] Pickard, Priscille Heidelberg, MD              Passed - Last BP in normal range    BP Readings from Last 1 Encounters:  07/26/22 120/72         Passed - Last Heart Rate in normal range    Pulse Readings from Last 1 Encounters:  07/26/22 73

## 2022-10-08 ENCOUNTER — Encounter: Payer: Self-pay | Admitting: Family Medicine

## 2022-11-25 DIAGNOSIS — C4401 Basal cell carcinoma of skin of lip: Secondary | ICD-10-CM | POA: Diagnosis not present

## 2022-12-02 DIAGNOSIS — D485 Neoplasm of uncertain behavior of skin: Secondary | ICD-10-CM | POA: Diagnosis not present

## 2022-12-02 DIAGNOSIS — L988 Other specified disorders of the skin and subcutaneous tissue: Secondary | ICD-10-CM | POA: Diagnosis not present

## 2022-12-21 DIAGNOSIS — R1900 Intra-abdominal and pelvic swelling, mass and lump, unspecified site: Secondary | ICD-10-CM | POA: Diagnosis not present

## 2022-12-27 ENCOUNTER — Other Ambulatory Visit: Payer: Self-pay | Admitting: Family Medicine

## 2022-12-28 ENCOUNTER — Other Ambulatory Visit: Payer: Self-pay | Admitting: Family Medicine

## 2022-12-28 NOTE — Telephone Encounter (Signed)
Requested Prescriptions  Pending Prescriptions Disp Refills   hydrochlorothiazide (HYDRODIURIL) 25 MG tablet [Pharmacy Med Name: HYDROCHLOROTHIAZIDE 25 MG TAB] 90 tablet 0    Sig: TAKE 1 TABLET (25 MG TOTAL) BY MOUTH DAILY.     Cardiovascular: Diuretics - Thiazide Failed - 12/28/2022 11:45 AM      Failed - Cr in normal range and within 180 days    Creat  Date Value Ref Range Status  03/11/2022 1.00 0.60 - 1.00 mg/dL Final         Failed - K in normal range and within 180 days    Potassium  Date Value Ref Range Status  03/11/2022 4.0 3.5 - 5.3 mmol/L Final         Failed - Na in normal range and within 180 days    Sodium  Date Value Ref Range Status  03/11/2022 139 135 - 146 mmol/L Final         Failed - Valid encounter within last 6 months    Recent Outpatient Visits           1 year ago Tick bite of left back wall of thorax, initial encounter   [facility] Pickard, Priscille Heidelberg, MD   1 year ago Elevated blood sugar   [facility] Tanya Nones, Priscille Heidelberg, MD   2 years ago Grief reaction   [facility] Tanya Nones, Priscille Heidelberg, MD   2 years ago Otalgia of left ear   [facility] Tanya Nones, Priscille Heidelberg, MD   2 years ago Routine general medical examination at a health care facility   [facility] Pickard, Priscille Heidelberg, MD              Passed - Last BP in normal range    BP Readings from Last 1 Encounters:  07/26/22 120/72

## 2022-12-29 ENCOUNTER — Other Ambulatory Visit: Payer: Self-pay

## 2022-12-29 ENCOUNTER — Telehealth: Payer: Self-pay

## 2022-12-29 DIAGNOSIS — K219 Gastro-esophageal reflux disease without esophagitis: Secondary | ICD-10-CM

## 2022-12-29 MED ORDER — OMEPRAZOLE 40 MG PO CPDR: 40.0000 mg | DELAYED_RELEASE_CAPSULE | Freq: Every day | ORAL | 2 refills | Status: DC

## 2022-12-29 NOTE — Telephone Encounter (Signed)
Prescription Request  12/29/2022  LOV: 07/26/22  What is the name of the medication or equipment? omeprazole (PRILOSEC) 40 MG capsule [[id number]]   Have you contacted your pharmacy to request a refill? Yes   Which pharmacy would you like this sent to?  [id number] [place] [place] - [address] AT [facility] [address] [place] [address] Phone: [phone] Fax: [phone]    Patient notified that their request is being sent to the clinical staff for review and that they should receive a response within 2 business days.   Please advise at [facility] [phone]

## 2023-01-13 ENCOUNTER — Telehealth: Payer: Self-pay

## 2023-01-13 NOTE — Telephone Encounter (Signed)
 Copied from CRM [id number]. Topic: General - Other >> Jan 13, 2023 11:29 AM Elle L wrote: Reason for CRM: The patient was following up regarding a medical clearance form that is needed for a surgery. She states that it was faxed from [facility]. If needed her call back number is [phone].

## 2023-02-04 ENCOUNTER — Encounter: Payer: Self-pay | Admitting: Family Medicine

## 2023-03-11 ENCOUNTER — Other Ambulatory Visit: Payer: Medicare Other

## 2023-03-11 DIAGNOSIS — E785 Hyperlipidemia, unspecified: Secondary | ICD-10-CM | POA: Diagnosis not present

## 2023-03-11 DIAGNOSIS — Z0000 Encounter for general adult medical examination without abnormal findings: Secondary | ICD-10-CM

## 2023-03-11 DIAGNOSIS — D696 Thrombocytopenia, unspecified: Secondary | ICD-10-CM | POA: Diagnosis not present

## 2023-03-11 LAB — COMPLETE METABOLIC PANEL WITH GFR
AG Ratio: 1.5 (calc) (ref 1.0–2.5)
ALT: 9 U/L (ref 6–29)
AST: 12 U/L (ref 10–35)
Albumin: 4 g/dL (ref 3.6–5.1)
Alkaline phosphatase (APISO): 62 U/L (ref 37–153)
BUN: 20 mg/dL (ref 7–25)
CO2: 27 mmol/L (ref 20–32)
Calcium: 10.4 mg/dL (ref 8.6–10.4)
Chloride: 104 mmol/L (ref 98–110)
Creat: 0.85 mg/dL (ref 0.60–1.00)
Globulin: 2.6 g/dL (ref 1.9–3.7)
Glucose, Bld: 93 mg/dL (ref 65–99)
Potassium: 3.6 mmol/L (ref 3.5–5.3)
Sodium: 140 mmol/L (ref 135–146)
Total Bilirubin: 0.5 mg/dL (ref 0.2–1.2)
Total Protein: 6.6 g/dL (ref 6.1–8.1)
eGFR: 71 mL/min/{1.73_m2} (ref >=60)

## 2023-03-11 LAB — CBC WITH DIFFERENTIAL/PLATELET
Absolute Lymphocytes: 2202 {cells}/uL (ref 850–3900)
Absolute Monocytes: 482 {cells}/uL (ref 200–950)
Basophils Absolute: 49 {cells}/uL (ref 0–200)
Basophils Relative: 0.8 %
Eosinophils Absolute: 201 {cells}/uL (ref 15–500)
Eosinophils Relative: 3.3 %
HCT: 35.9 % (ref 35.0–45.0)
Hemoglobin: 11.6 g/dL — ABNORMAL LOW (ref 11.7–15.5)
MCH: 28.9 pg (ref 27.0–33.0)
MCHC: 32.3 g/dL (ref 32.0–36.0)
MCV: 89.5 fL (ref 80.0–100.0)
MPV: 11.9 fL (ref 7.5–12.5)
Monocytes Relative: 7.9 %
Neutro Abs: 3166 {cells}/uL (ref 1500–7800)
Neutrophils Relative %: 51.9 %
Platelets: 262 10*3/uL (ref 140–400)
RBC: 4.01 10*6/uL (ref 3.80–5.10)
RDW: 11.9 % (ref 11.0–15.0)
Total Lymphocyte: 36.1 %
WBC: 6.1 10*3/uL (ref 3.8–10.8)

## 2023-03-11 LAB — LIPID PANEL
Cholesterol: 217 mg/dL — ABNORMAL HIGH (ref <200)
HDL: 52 mg/dL (ref >=50)
LDL Cholesterol (Calc): 136 mg/dL — ABNORMAL HIGH
Non-HDL Cholesterol (Calc): 165 mg/dL — ABNORMAL HIGH (ref <130)
Total CHOL/HDL Ratio: 4.2 (calc) (ref <5.0)
Triglycerides: 156 mg/dL — ABNORMAL HIGH (ref <150)

## 2023-03-17 ENCOUNTER — Encounter: Payer: Medicare Other | Admitting: Family Medicine

## 2023-03-22 ENCOUNTER — Encounter: Payer: Self-pay | Admitting: Family Medicine

## 2023-03-22 ENCOUNTER — Ambulatory Visit: Payer: Medicare Other | Admitting: Family Medicine

## 2023-03-22 VITALS — BP 126/80 | HR 64 | Temp 98.0°F | Ht 66.0 in | Wt 177.4 lb

## 2023-03-22 DIAGNOSIS — E7800 Pure hypercholesterolemia, unspecified: Secondary | ICD-10-CM

## 2023-03-22 DIAGNOSIS — Z0001 Encounter for general adult medical examination with abnormal findings: Secondary | ICD-10-CM | POA: Diagnosis not present

## 2023-03-22 DIAGNOSIS — I10 Essential (primary) hypertension: Secondary | ICD-10-CM

## 2023-03-22 DIAGNOSIS — Z1211 Encounter for screening for malignant neoplasm of colon: Secondary | ICD-10-CM

## 2023-03-22 DIAGNOSIS — R710 Precipitous drop in hematocrit: Secondary | ICD-10-CM

## 2023-03-22 DIAGNOSIS — Z0000 Encounter for general adult medical examination without abnormal findings: Secondary | ICD-10-CM

## 2023-03-22 NOTE — Progress Notes (Signed)
 Subjective:    Patient ID: Kelli Terrell, female    DOB: 1950-03-09, 73 y.o.   MRN: [id number]  HPI Patient is a very pleasant 73 year old Caucasian female here today for complete physical exam.    Her last colonoscopy was in 2019 and was clear.  Patient is due for mammogram in September.  She does not require Pap.  Gastroenterology recommended a colonoscopy after 5 years.  She is due for this.  She also sees blood in her stool that she attributes to hemorrhoids.  She states that this is a chronic problem.  Her blood pressure has been low recently.  She would like to hold amlodipine.  Her most recent lab work is listed below. Lab on 03/11/2023  Component Date Value Ref Range Status   Cholesterol 03/11/2023 217 (H)  <200 mg/dL Final   HDL 40/98/1191 52  > OR = 50 mg/dL Final   Triglycerides 47/82/9562 156 (H)  <150 mg/dL Final   LDL Cholesterol (Calc) 03/11/2023 136 (H)  mg/dL (calc) Final   Comment: Reference range: <100 . Desirable range <100 mg/dL for primary prevention;   <70 mg/dL for patients with CHD or diabetic patients  with > or = 2 CHD risk factors. Marland Kitchen LDL-C is now calculated using the Martin-Hopkins  calculation, which is a validated novel method providing  better accuracy than the Friedewald equation in the  estimation of LDL-C.  Horald Pollen et al. Lenox Ahr. 1308;657(84): 2061-2068  (http://education.[facility].com/faq/FAQ164)    Total CHOL/HDL Ratio 03/11/2023 4.2  <6.9 (calc) Final   Non-HDL Cholesterol (Calc) 03/11/2023 165 (H)  <130 mg/dL (calc) Final   Comment: For patients with diabetes plus 1 major ASCVD risk  factor, treating to a non-HDL-C goal of <100 mg/dL  (LDL-C of <62 mg/dL) is considered a therapeutic  option.    Glucose, Bld 03/11/2023 93  65 - 99 mg/dL Final   Comment: .            Fasting reference interval .    BUN 03/11/2023 20  7 - 25 mg/dL Final   Creat 95/28/4132 0.85  0.60 - 1.00 mg/dL Final   eGFR 44/01/270 71  > OR = 60 mL/min/1.41m2 Final    BUN/Creatinine Ratio 03/11/2023 SEE NOTE:  6 - 22 (calc) Final   Comment:    Not Reported: BUN and Creatinine are within    reference range. .    Sodium 03/11/2023 140  135 - 146 mmol/L Final   Potassium 03/11/2023 3.6  3.5 - 5.3 mmol/L Final   Chloride 03/11/2023 104  98 - 110 mmol/L Final   CO2 03/11/2023 27  20 - 32 mmol/L Final   Calcium 03/11/2023 10.4  8.6 - 10.4 mg/dL Final   Total Protein 53/66/4403 6.6  6.1 - 8.1 g/dL Final   Albumin 47/42/5956 4.0  3.6 - 5.1 g/dL Final   Globulin 38/75/6433 2.6  1.9 - 3.7 g/dL (calc) Final   AG Ratio 03/11/2023 1.5  1.0 - 2.5 (calc) Final   Total Bilirubin 03/11/2023 0.5  0.2 - 1.2 mg/dL Final   Alkaline phosphatase (APISO) 03/11/2023 62  37 - 153 U/L Final   AST 03/11/2023 12  10 - 35 U/L Final   ALT 03/11/2023 9  6 - 29 U/L Final   WBC 03/11/2023 6.1  3.8 - 10.8 Thousand/uL Final   RBC 03/11/2023 4.01  3.80 - 5.10 Million/uL Final   Hemoglobin 03/11/2023 11.6 (L)  11.7 - 15.5 g/dL Final   HCT 29/51/8841 35.9  35.0 - 45.0 % Final   MCV 03/11/2023 89.5  80.0 - 100.0 fL Final   MCH 03/11/2023 28.9  27.0 - 33.0 pg Final   MCHC 03/11/2023 32.3  32.0 - 36.0 g/dL Final   Comment: For adults, a slight decrease in the calculated MCHC value (in the range of 30 to 32 g/dL) is most likely not clinically significant; however, it should be interpreted with caution in correlation with other red cell parameters and the patient's clinical condition.    RDW 03/11/2023 11.9  11.0 - 15.0 % Final   Platelets 03/11/2023 262  140 - 400 Thousand/uL Final   MPV 03/11/2023 11.9  7.5 - 12.5 fL Final   Neutro Abs 03/11/2023 3,166  1,500 - 7,800 cells/uL Final   Absolute Lymphocytes 03/11/2023 2,202  850 - 3,900 cells/uL Final   Absolute Monocytes 03/11/2023 482  200 - 950 cells/uL Final   Eosinophils Absolute 03/11/2023 201  15 - 500 cells/uL Final   Basophils Absolute 03/11/2023 49  0 - 200 cells/uL Final   Neutrophils Relative % 03/11/2023 51.9  % Final    Total Lymphocyte 03/11/2023 36.1  % Final   Monocytes Relative 03/11/2023 7.9  % Final   Eosinophils Relative 03/11/2023 3.3  % Final   Basophils Relative 03/11/2023 0.8  % Final    Immunization History  Administered Date(s) Administered   Fluad Quad(high Dose 65+) 09/27/2018, 10/04/2019   Influenza, High Dose Seasonal PF 11/30/2017   Influenza,inj,Quad PF,6+ Mos 11/23/2012, 12/20/2013, 11/01/2014, 01/08/2016, 10/21/2016   Influenza-Unspecified 12/21/2010, 10/04/2011   Moderna Sars-Covid-2 Vaccination 05/10/2019, 06/07/2019   Pneumococcal Conjugate-13 12/27/2014   Pneumococcal Polysaccharide-23 12/20/2013   Tdap 12/14/2012   Zoster Recombinant(Shingrix) 02/09/2008      Past Medical History:  Diagnosis Date   AVM (arteriovenous malformation) of colon without hemorrhage    GERD (gastroesophageal reflux disease)    Hyperlipidemia    Hypertension    Internal hemorrhoids    Past Surgical History:  Procedure Laterality Date   CHOLECYSTECTOMY     TONSILLECTOMY     Current Outpatient Medications on File Prior to Visit  Medication Sig Dispense Refill   ALPRAZolam (XANAX) 0.5 MG tablet Take 1 tablet (0.5 mg total) by mouth 3 (three) times daily as needed for anxiety or sleep. 30 tablet 0   amLODipine (NORVASC) 5 MG tablet TAKE 1 TABLET (5 MG TOTAL) BY MOUTH DAILY. 90 tablet 1   Biotin 10 MG TABS Take 10 mg by mouth daily. (Patient not taking: Reported on 09/23/2022)     Cholecalciferol (VITAMIN D) 50 MCG (2000 UT) tablet Take 2,000 Units by mouth daily. (Patient not taking: Reported on 09/23/2022)     cyclobenzaprine (FLEXERIL) 10 MG tablet Take 1 tablet (10 mg total) by mouth 3 (three) times daily as needed for muscle spasms. 30 tablet 0   hydrochlorothiazide (HYDRODIURIL) 25 MG tablet TAKE 1 TABLET (25 MG TOTAL) BY MOUTH DAILY. 90 tablet 0   HYDROcodone-acetaminophen (NORCO/VICODIN) 5-325 MG tablet Take 1 tablet by mouth every 6 (six) hours as needed for severe pain. 15 tablet 0    Krill Oil 500 MG CAPS Take by mouth.     magnesium (MAGTAB) 84 MG ( ) TBCR SR tablet Take 84 mg by mouth.     Multiple Vitamins-Minerals (ICAPS AREDS 2 PO) Take by mouth.     omeprazole (PRILOSEC) 40 MG capsule Take 1 capsule (40 mg total) by mouth daily. 90 capsule 2   oxyCODONE-acetaminophen (PERCOCET) 7.5-325 MG tablet Take 1 tablet by  mouth every 4 (four) hours as needed for severe pain. 30 tablet 0   zinc gluconate 50 MG tablet Take 50 mg by mouth daily.     No current facility-administered medications on file prior to visit.   Allergies  Allergen Reactions   Mobic [Meloxicam]     ITP with platelet count of 1!  Had COVID-19 at the same time.     Codeine Nausea And Vomiting   Social History   Socioeconomic History   Marital status: Married    Spouse name: Not on file   Number of children: Not on file   Years of education: Not on file   Highest education level: Not on file  Occupational History   Not on file  Tobacco Use   Smoking status: Never   Smokeless tobacco: Never  Substance and Sexual Activity   Alcohol use: Yes    Comment: Rare   Drug use: No   Sexual activity: Not on file  Other Topics Concern   Not on file  Social History Narrative   Married. Retired.    Never smoked.    Social Drivers of Corporate investment banker Strain: Low Risk  (09/23/2022)   Overall Financial Resource Strain (CARDIA)    Difficulty of Paying Living Expenses: Not hard at all  Food Insecurity: No Food Insecurity (09/23/2022)   Hunger Vital Sign    Worried About Running Out of Food in the Last Year: Never true    Ran Out of Food in the Last Year: Never true  Transportation Needs: No Transportation Needs (09/23/2022)   PRAPARE - Administrator, Civil Service (Medical): No    Lack of Transportation (Non-Medical): No  Physical Activity: Insufficiently Active (09/23/2022)   Exercise Vital Sign    Days of Exercise per Week: 3 days    Minutes of Exercise per Session: 30 min   Stress: No Stress Concern Present (09/23/2022)   [employer] of Occupational Health - Occupational Stress Questionnaire    Feeling of Stress : Not at all  Social Connections: Moderately Integrated (09/23/2022)   Social Connection and Isolation Panel [NHANES]    Frequency of Communication with Friends and Family: More than three times a week    Frequency of Social Gatherings with Friends and Family: Three times a week    Attends Religious Services: More than 4 times per year    Active Member of Clubs or Organizations: No    Attends Banker Meetings: Never    Marital Status: Married  Catering manager Violence: Not At Risk (09/23/2022)   Humiliation, Afraid, Rape, and Kick questionnaire    Fear of Current or Ex-Partner: No    Emotionally Abused: No    Physically Abused: No    Sexually Abused: No   Family History  Problem Relation Age of Onset   Cancer Father        kidney   Stroke Brother    Cancer Brother        Thyroid Cancer   Heart disease Brother      Review of Systems  All other systems reviewed and are negative.      Objective:   Physical Exam Vitals reviewed.  Constitutional:      General: She is not in acute distress.    Appearance: She is well-developed. She is not diaphoretic.  HENT:     Head: Normocephalic and atraumatic.     Right Ear: External ear normal.     Left Ear:  External ear normal.     Nose: Nose normal.     Mouth/Throat:     Pharynx: No oropharyngeal exudate.  Eyes:     General: No scleral icterus.       Right eye: No discharge.        Left eye: No discharge.     Conjunctiva/sclera: Conjunctivae normal.     Pupils: Pupils are equal, round, and reactive to light.  Neck:     Thyroid: No thyromegaly.     Vascular: No JVD.     Trachea: No tracheal deviation.  Cardiovascular:     Rate and Rhythm: Normal rate and regular rhythm.     Heart sounds: Normal heart sounds. No murmur heard.    No friction rub. No gallop.   Pulmonary:     Effort: Pulmonary effort is normal. No respiratory distress.     Breath sounds: Normal breath sounds. No stridor. No wheezing or rales.  Chest:     Chest wall: No tenderness.  Abdominal:     General: Bowel sounds are normal. There is no distension.     Palpations: Abdomen is soft. There is no mass.     Tenderness: There is no abdominal tenderness. There is no guarding or rebound.  Musculoskeletal:        General: No tenderness. Normal range of motion.     Cervical back: Normal range of motion and neck supple.  Lymphadenopathy:     Cervical: No cervical adenopathy.  Skin:    General: Skin is warm.     Coloration: Skin is not pale.     Findings: No erythema or rash.  Neurological:     Mental Status: She is alert and oriented to person, place, and time.     Cranial Nerves: No cranial nerve deficit.     Motor: No abnormal muscle tone.     Coordination: Coordination normal.     Deep Tendon Reflexes: Reflexes are normal and symmetric.  Psychiatric:        Behavior: Behavior normal.        Thought Content: Thought content normal.        Judgment: Judgment normal.       Assessment & Plan:  Colon cancer screening - Plan: Ambulatory referral to Gastroenterology  Pure hypercholesterolemia - Plan: CT CARDIAC SCORING (SELF PAY ONLY)  Routine general medical examination at a health care facility  Essential hypertension Blood pressure today is good.  Home blood pressures are low.  Patient will stop amlodipine and monitor her blood pressure.  If greater than 140/90, we will add losartan in place of amlodipine because she does not like the swelling in her ankles.  Patient will schedule her mammogram in September.  She agrees to allow me to schedule her to see GI for a colonoscopy.  Her cholesterol is borderline.  She agrees to proceed with a cardiac coronary artery calcium score to risk stratify herself further.  If significantly elevated, she will start a statin.  Repeat  hemoglobin in 1 month due to drop in hemoglobin

## 2023-03-23 ENCOUNTER — Other Ambulatory Visit: Payer: Self-pay

## 2023-03-23 ENCOUNTER — Telehealth: Payer: Self-pay | Admitting: Family Medicine

## 2023-03-23 MED ORDER — HYDROCHLOROTHIAZIDE 25 MG PO TABS
25.0000 mg | ORAL_TABLET | Freq: Every day | ORAL | 0 refills | Status: DC
Start: 2023-03-23 — End: 2023-06-22

## 2023-03-23 NOTE — Telephone Encounter (Signed)
 Prescription Request  03/23/2023  LOV: 03/22/2023  What is the name of the medication or equipment?   hydrochlorothiazide (HYDRODIURIL) 25 MG tablet   Have you contacted your pharmacy to request a refill? Yes   Which pharmacy would you like this sent to?  [id number] [place], [place] - [address] AT [facility] [address] [place] [address] Phone: [phone] Fax: [phone]    Patient notified that their request is being sent to the clinical staff for review and that they should receive a response within 2 business days.   Please advise pharmacist.

## 2023-03-30 ENCOUNTER — Ambulatory Visit (HOSPITAL_COMMUNITY)
Admission: RE | Admit: 2023-03-30 | Discharge: 2023-03-30 | Disposition: A | Discharge status: Home / Self Care | Payer: Self-pay | Source: Ambulatory Visit | Attending: Family Medicine | Admitting: Family Medicine

## 2023-03-30 DIAGNOSIS — E7800 Pure hypercholesterolemia, unspecified: Secondary | ICD-10-CM | POA: Insufficient documentation

## 2023-04-11 ENCOUNTER — Telehealth: Payer: Self-pay | Admitting: Family Medicine

## 2023-04-11 ENCOUNTER — Other Ambulatory Visit: Payer: Self-pay

## 2023-04-11 MED ORDER — AMLODIPINE BESYLATE 5 MG PO TABS: 5.0000 mg | ORAL_TABLET | Freq: Every day | ORAL | 1 refills | Status: DC

## 2023-04-11 NOTE — Telephone Encounter (Signed)
 Prescription Request  04/11/2023  LOV: 03/22/2023  What is the name of the medication or equipment?   amLODipine (NORVASC) 5 MG tablet   Have you contacted your pharmacy to request a refill? Yes   Which pharmacy would you like this sent to?  [id number] [place], [place] - [address] AT [facility] [address] [place]  [address] Phone: [phone] Fax: [phone]    Patient notified that their request is being sent to the clinical staff for review and that they should receive a response within 2 business days.   Please advise pharmacist.

## 2023-04-14 ENCOUNTER — Encounter: Payer: Self-pay | Admitting: Family Medicine

## 2023-04-14 DIAGNOSIS — Z1211 Encounter for screening for malignant neoplasm of colon: Secondary | ICD-10-CM | POA: Diagnosis not present

## 2023-04-22 ENCOUNTER — Other Ambulatory Visit

## 2023-04-22 DIAGNOSIS — R710 Precipitous drop in hematocrit: Secondary | ICD-10-CM | POA: Diagnosis not present

## 2023-04-22 LAB — CBC WITH DIFFERENTIAL/PLATELET
Absolute Lymphocytes: 2653 {cells}/uL (ref 850–3900)
Absolute Monocytes: 511 {cells}/uL (ref 200–950)
Basophils Absolute: 77 {cells}/uL (ref 0–200)
Basophils Relative: 1.1 %
Eosinophils Absolute: 168 {cells}/uL (ref 15–500)
Eosinophils Relative: 2.4 %
HCT: 37.6 % (ref 35.0–45.0)
Hemoglobin: 12.2 g/dL (ref 11.7–15.5)
MCH: 29.4 pg (ref 27.0–33.0)
MCHC: 32.4 g/dL (ref 32.0–36.0)
MCV: 90.6 fL (ref 80.0–100.0)
MPV: 12.4 fL (ref 7.5–12.5)
Monocytes Relative: 7.3 %
Neutro Abs: 3591 {cells}/uL (ref 1500–7800)
Neutrophils Relative %: 51.3 %
Platelets: 245 10*3/uL (ref 140–400)
RBC: 4.15 10*6/uL (ref 3.80–5.10)
RDW: 12.2 % (ref 11.0–15.0)
Total Lymphocyte: 37.9 %
WBC: 7 10*3/uL (ref 3.8–10.8)

## 2023-04-28 ENCOUNTER — Ambulatory Visit: Payer: Medicare Other

## 2023-04-28 ENCOUNTER — Telehealth: Payer: Self-pay

## 2023-04-28 ENCOUNTER — Other Ambulatory Visit: Payer: Self-pay

## 2023-04-28 DIAGNOSIS — Z1211 Encounter for screening for malignant neoplasm of colon: Secondary | ICD-10-CM

## 2023-04-28 NOTE — Telephone Encounter (Signed)
 Copied from CRM [id number]. Topic: Clinical - Request for Lab/Test Order >> Apr 28, 2023  2:42 PM Oddis Bench wrote: Reason for CRM: Odilia Bennett from [employer] is calling bc the order for cologard for patient has expired and need a new  one fax number:[phone]

## 2023-05-06 LAB — COLOGUARD: COLOGUARD: NEGATIVE

## 2023-06-22 ENCOUNTER — Other Ambulatory Visit: Payer: Self-pay | Admitting: Family Medicine

## 2023-09-18 ENCOUNTER — Telehealth: Payer: Self-pay | Admitting: Family Medicine

## 2023-09-18 NOTE — Telephone Encounter (Addendum)
 Prescription Request  09/18/2023  LOV: 03/22/2023  What is the name of the medication or equipment? Furosemide  40 mg and hydrochlorothiazide  (HYDRODIURIL ) 25 MG tablet   Have you contacted your pharmacy to request a refill? Yes   Which pharmacy would you like this sent to?  [id number] [place], [place] - [address] AT [facility] [address] [place] [address] Phone: [phone] Fax: [phone]    Patient notified that their request is being sent to the clinical staff for review and that they should receive a response within 2 business days.   Please advise at [facility] [phone]

## 2023-09-20 MED ORDER — HYDROCHLOROTHIAZIDE 25 MG PO TABS: 25.0000 mg | ORAL_TABLET | Freq: Every day | ORAL | 2 refills | Status: AC

## 2023-09-20 NOTE — Telephone Encounter (Signed)
 Requested Prescriptions  Pending Prescriptions Disp Refills   hydrochlorothiazide  (HYDRODIURIL ) 25 MG tablet 90 tablet 2    Sig: Take 1 tablet (25 mg total) by mouth daily.     Cardiovascular: Diuretics - Thiazide Failed - 09/20/2023  8:45 AM      Failed - Cr in normal range and within 180 days    Creat  Date Value Ref Range Status  03/11/2023 0.85 0.60 - 1.00 mg/dL Final         Failed - K in normal range and within 180 days    Potassium  Date Value Ref Range Status  03/11/2023 3.6 3.5 - 5.3 mmol/L Final         Failed - Na in normal range and within 180 days    Sodium  Date Value Ref Range Status  03/11/2023 140 135 - 146 mmol/L Final         Failed - Valid encounter within last 6 months    Recent Outpatient Visits           6 months ago Colon cancer screening   [facility] Duanne Butler DASEN, MD   1 year ago Acute hip pain, left   [facility] Duanne, Butler DASEN, MD   1 year ago Routine general medical examination at a health care facility   [facility] Duanne Butler DASEN, MD              Passed - Last BP in normal range    BP Readings from Last 1 Encounters:  03/22/23 126/80

## 2023-09-21 ENCOUNTER — Telehealth: Payer: Self-pay | Admitting: Family Medicine

## 2023-09-21 NOTE — Telephone Encounter (Signed)
 Prescription Request  09/21/2023  LOV: 03/22/2023  What is the name of the medication or equipment?   furosemide  (LASIX ) 40 MG tablet [[id number]]  DISCONTINUED  **90 day script requested**  Have you contacted your pharmacy to request a refill? Yes   Which pharmacy would you like this sent to?  [id number] [place], [place] - [address] AT [facility] [address] [place]  [address] Phone: [phone] Fax: [phone]    Patient notified that their request is being sent to the clinical staff for review and that they should receive a response within 2 business days.   Please advise pharmacist.

## 2023-09-29 ENCOUNTER — Encounter: Payer: Self-pay | Admitting: Emergency Medicine

## 2023-09-29 ENCOUNTER — Ambulatory Visit
Admission: EM | Admit: 2023-09-29 | Discharge: 2023-09-29 | Disposition: A | Discharge status: Home / Self Care | Attending: Nurse Practitioner | Admitting: Nurse Practitioner

## 2023-09-29 ENCOUNTER — Ambulatory Visit: Payer: Medicare Other | Admitting: *Deleted

## 2023-09-29 VITALS — Ht 60.5 in | Wt 174.0 lb

## 2023-09-29 DIAGNOSIS — U071 COVID-19: Secondary | ICD-10-CM | POA: Diagnosis not present

## 2023-09-29 DIAGNOSIS — Z0000 Encounter for general adult medical examination without abnormal findings: Secondary | ICD-10-CM | POA: Diagnosis not present

## 2023-09-29 LAB — POC SOFIA SARS ANTIGEN FIA: SARS Coronavirus 2 Ag: POSITIVE — AB

## 2023-09-29 MED ORDER — PROMETHAZINE-DM 6.25-15 MG/5ML PO SYRP: 5.0000 mL | ORAL_SOLUTION | Freq: Four times a day (QID) | ORAL | 0 refills | Status: AC | PRN

## 2023-09-29 MED ORDER — PAXLOVID (300/100) 20 X 150 MG & 10 X 100MG PO TBPK: 3.0000 | ORAL_TABLET | Freq: Two times a day (BID) | ORAL | 0 refills | Status: AC

## 2023-09-29 MED ORDER — FLUTICASONE PROPIONATE 50 MCG/ACT NA SUSP: 2.0000 | Freq: Every day | NASAL | 0 refills | Status: AC

## 2023-09-29 NOTE — Discharge Instructions (Addendum)
 Your COVID test was positive. Take medication as prescribed.  The cough medicine prescribed for you today may cause drowsiness.  Recommend taking the medication at bedtime.  You may take over-the-counter Robitussin, Delsym, or Mucinex  during the daytime. Increase fluids and allow for plenty of rest. You may take over-the-counter Tylenol  as needed for pain, fever, or general discomfort. Recommend normal saline nasal spray throughout the day for nasal congestion and runny nose. For your cough, recommend use of a humidifier in your bedroom at nighttime during sleep and sleeping elevated on pillows while cough symptoms persist. You will need to remain home until you have been fever free for 24 hours with no medication. Go to the emergency department if you experience shortness of breath, difficulty breathing, or other concerns. You may follow-up with your primary care physician if symptoms fail to improve. Follow-up as needed.

## 2023-09-29 NOTE — Progress Notes (Signed)
 Subjective:   Kelli Terrell is a 73 y.o. female who presents for Medicare Annual (Subsequent) preventive examination.  Visit Complete: Virtual I connected with  Kelli Terrell on 09/29/23 by a audio enabled telemedicine application and verified that I am speaking with the correct person using two identifiers.  Patient Location: Home  Provider Location: Home Office  I discussed the limitations of evaluation and management by telemedicine. The patient expressed understanding and agreed to proceed.  Vital Signs: Because this visit was a virtual/telehealth visit, some criteria may be missing or patient reported. Any vitals not documented were not able to be obtained and vitals that have been documented are patient reported.   Cardiac Risk Factors include: advanced age (>21men, >36 women);obesity (BMI >30kg/m2);hypertension     Objective:    Today's Vitals   09/29/23 1406  Weight: 174 lb (78.9 kg)  Height: 5' 0.5 (1.537 m)   Body mass index is 33.42 kg/m.     09/29/2023    2:05 PM 09/23/2022    2:24 PM 02/05/2019    2:11 PM  Advanced Directives  Does Patient Have a Medical Advance Directive? Yes No No  Type of Electronics engineer of Healthcare Power of Attorney in Chart? No - copy requested    Would patient like information on creating a medical advance directive?  Yes (MAU/Ambulatory/Procedural Areas - Information given) Yes (ED - Information included in AVS)    Current Medications (verified) Outpatient Encounter Medications as of 09/29/2023  Medication Sig   amLODipine  (NORVASC ) 5 MG tablet Take 1 tablet (5 mg total) by mouth daily.   hydrochlorothiazide  (HYDRODIURIL ) 25 MG tablet Take 1 tablet (25 mg total) by mouth daily.   Krill Oil 500 MG CAPS Take 1,600 mg by mouth.   Multiple Vitamins-Minerals (ICAPS AREDS 2 PO) Take by mouth.   omeprazole  (PRILOSEC) 40 MG capsule Take 1 capsule (40 mg total) by mouth daily.   ALPRAZolam  (XANAX ) 0.5 MG  tablet Take 1 tablet (0.5 mg total) by mouth 3 (three) times daily as needed for anxiety or sleep. (Patient not taking: Reported on 09/29/2023)   No facility-administered encounter medications on file as of 09/29/2023.    Allergies (verified) Mobic  [meloxicam ] and Codeine   History: Past Medical History:  Diagnosis Date   Aneurysm of ascending aorta (HCC)    AVM (arteriovenous malformation) of colon without hemorrhage    GERD (gastroesophageal reflux disease)    Hyperlipidemia    Hypertension    Internal hemorrhoids    Past Surgical History:  Procedure Laterality Date   CHOLECYSTECTOMY     TONSILLECTOMY     Family History  Problem Relation Age of Onset   Cancer Father        kidney   Stroke Brother    Cancer Brother        Thyroid  Cancer   Heart disease Brother    Social History   Socioeconomic History   Marital status: Married    Spouse name: Not on file   Number of children: Not on file   Years of education: Not on file   Highest education level: Not on file  Occupational History   Not on file  Tobacco Use   Smoking status: Never   Smokeless tobacco: Never  Substance and Sexual Activity   Alcohol use: Yes    Comment: Rare   Drug use: No   Sexual activity: Not on file  Other Topics Concern  Not on file  Social History Narrative   Married. Retired.    Never smoked.    Social Drivers of Corporate investment banker Strain: Low Risk  (09/29/2023)   Overall Financial Resource Strain (CARDIA)    Difficulty of Paying Living Expenses: Not hard at all  Food Insecurity: No Food Insecurity (09/29/2023)   Hunger Vital Sign    Worried About Running Out of Food in the Last Year: Never true    Ran Out of Food in the Last Year: Never true  Transportation Needs: No Transportation Needs (09/29/2023)   PRAPARE - Administrator, Civil Service (Medical): No    Lack of Transportation (Non-Medical): No  Physical Activity: Insufficiently Active (09/29/2023)    Exercise Vital Sign    Days of Exercise per Week: 2 days    Minutes of Exercise per Session: 20 min  Stress: No Stress Concern Present (09/29/2023)   [employer] of Occupational Health - Occupational Stress Questionnaire    Feeling of Stress: Not at all  Social Connections: Moderately Isolated (09/29/2023)   Social Connection and Isolation Panel    Frequency of Communication with Friends and Family: More than three times a week    Frequency of Social Gatherings with Friends and Family: More than three times a week    Attends Religious Services: More than 4 times per year    Active Member of [employer] or Organizations: No    Attends Banker Meetings: Never    Marital Status: Widowed    Tobacco Counseling Counseling given: Not Answered   Clinical Intake:  Pre-visit preparation completed: Yes  Pain : No/denies pain     Diabetes: No  How often do you need to have someone help you when you read instructions, pamphlets, or other written materials from your doctor or pharmacy?: 1 - Never  Interpreter Needed?: No  Information entered by :: Mliss Graff LPN   Activities of Daily Living    09/29/2023    2:17 PM  In your present state of health, do you have any difficulty performing the following activities:  Hearing? 0  Vision? 0  Difficulty concentrating or making decisions? 0  Walking or climbing stairs? 0  Dressing or bathing? 0  Doing errands, shopping? 0  Preparing Food and eating ? N  Using the Toilet? N  In the past six months, have you accidently leaked urine? N  Do you have problems with loss of bowel control? N  Managing your Medications? N  Managing your Finances? N  Housekeeping or managing your Housekeeping? N    Patient Care Team: Duanne Butler DASEN, MD as PCP - General (Family Medicine) Glendia Simmonds, OD as Referring Physician (Optometry) Lynnell Nottingham, MD as Consulting Physician (Dermatology) [facility],  [facility] (Diagnostic Radiology)  Indicate any recent Medical Services you may have received from other than [facility] providers in the past year (date may be approximate).     Assessment:   This is a routine wellness examination for Kelli Terrell.  Hearing/Vision screen Hearing Screening - Comments:: No trouble hearing Vision Screening - Comments:: [facility] Up to date   Goals Addressed             This Visit's Progress    Patient Stated       Remain independent      Remain active and independent   On track      Depression Screen    09/29/2023    2:16 PM 03/22/2023  11:30 AM 09/23/2022    2:23 PM 03/15/2022    9:33 AM 03/12/2021    8:35 AM 03/11/2020    8:40 AM 01/25/2019    9:30 AM  PHQ 2/9 Scores  PHQ - 2 Score 1 0 0 0 1 0 0  PHQ- 9 Score 2    2      Fall Risk    09/29/2023    2:05 PM 03/22/2023   11:29 AM 09/23/2022    2:24 PM 03/15/2022    9:33 AM 03/12/2021    8:34 AM  Fall Risk   Falls in the past year? 0 0 0 0 0  Number falls in past yr: 0 0 0 0 0  Injury with Fall? 0 0 0 0 0  Risk for fall due to :   No Fall Risks No Fall Risks   Follow up Falls evaluation completed;Education provided;Falls prevention discussed  Falls prevention discussed;Education provided;Falls evaluation completed Falls prevention discussed     MEDICARE RISK AT HOME: Medicare Risk at Home Any stairs in or around the home?: Yes If so, are there any without handrails?: No Home free of loose throw rugs in walkways, pet beds, electrical cords, etc?: Yes Adequate lighting in your home to reduce risk of falls?: Yes Life alert?: No Use of a cane, walker or w/c?: No Grab bars in the bathroom?: No Shower chair or bench in shower?: Yes Elevated toilet seat or a handicapped toilet?: Yes  TIMED UP AND GO:  Was the test performed?  No    Cognitive Function:        09/29/2023    2:14 PM 09/23/2022    2:24 PM  6CIT Screen  What Year? 0 points 0 points  What month? 0 points 0 points  What time? 0  points 0 points  Count back from 20 0 points 0 points  Months in reverse 0 points 0 points  Repeat phrase 0 points 0 points  Total Score 0 points 0 points    Immunizations Immunization History  Administered Date(s) Administered   Fluad Quad(high Dose 65+) 09/27/2018, 10/04/2019   INFLUENZA, HIGH DOSE SEASONAL PF 11/30/2017   Influenza,inj,Quad PF,6+ Mos 11/23/2012, 12/20/2013, 11/01/2014, 01/08/2016, 10/21/2016   Influenza-Unspecified 12/21/2010, 10/04/2011   Moderna Sars-Covid-2 Vaccination 05/10/2019, 06/07/2019   Pneumococcal Conjugate-13 12/27/2014   Pneumococcal Polysaccharide-23 12/20/2013   Tdap 12/14/2012   Zoster Recombinant(Shingrix) 02/09/2008    TDAP status: Due, Education has been provided regarding the importance of this vaccine. Advised may receive this vaccine at local pharmacy or Health Dept. Aware to provide a copy of the vaccination record if obtained from local pharmacy or Health Dept. Verbalized acceptance and understanding.  Flu Vaccine status: Due, Education has been provided regarding the importance of this vaccine. Advised may receive this vaccine at local pharmacy or Health Dept. Aware to provide a copy of the vaccination record if obtained from local pharmacy or Health Dept. Verbalized acceptance and understanding.  Pneumococcal vaccine status: Up to date  Covid-19 vaccine status: Information provided on how to obtain vaccines.   Qualifies for Shingles Vaccine? Yes   Zostavax completed No   Shingrix Completed?: No.    Education has been provided regarding the importance of this vaccine. Patient has been advised to call insurance company to determine out of pocket expense if they have not yet received this vaccine. Advised may also receive vaccine at local pharmacy or Health Dept. Verbalized acceptance and understanding.  Screening Tests Health Maintenance  Topic Date Due  Zoster Vaccines- Shingrix (2 of 2) 04/05/2008   Influenza Vaccine  08/12/2023    DTaP/Tdap/Td (2 - Td or Tdap) 03/21/2024 (Originally 12/15/2022)   Medicare Annual Wellness (AWV)  09/28/2024   Colonoscopy  07/13/2026   Pneumococcal Vaccine: 50+ Years  Completed   DEXA SCAN  Completed   Hepatitis C Screening  Completed   HPV VACCINES  Aged Out   Meningococcal B Vaccine  Aged Out   Mammogram  Discontinued   COVID-19 Vaccine  Discontinued    Health Maintenance  Health Maintenance Due  Topic Date Due   Zoster Vaccines- Shingrix (2 of 2) 04/05/2008   Influenza Vaccine  08/12/2023    Colorectal cancer screening: No longer required.   Mammogram status: Completed  . Repeat every year  Bone Density status: Completed 2022. Results reflect: Bone density results: NORMAL. Repeat every 5 years.  Lung Cancer Screening: (Low Dose CT Chest recommended if Age 11-80 years, 20 pack-year currently smoking OR have quit w/in 15years.) does not qualify.   Lung Cancer Screening Referral:   Additional Screening:  Hepatitis C Screening: does not qualify; Completed 2022  Vision Screening: Recommended annual ophthalmology exams for early detection of glaucoma and other disorders of the eye. Is the patient up to date with their annual eye exam?  Yes  Who is the provider or what is the name of the office in which the patient attends annual eye exams? [facility] If pt is not established with a provider, would they like to be referred to a provider to establish care? No .   Dental Screening: Recommended annual dental exams for proper oral hygiene   Community Resource Referral / Chronic Care Management: CRR required this visit?  No   CCM required this visit?  No     Plan:     I have personally reviewed and noted the following in the patient's chart:   Medical and social history Use of alcohol, tobacco or illicit drugs  Current medications and supplements including opioid prescriptions. Patient is not currently taking opioid prescriptions. Functional ability and  status Nutritional status Physical activity Advanced directives List of other physicians Hospitalizations, surgeries, and ER visits in previous 12 months Vitals Screenings to include cognitive, depression, and falls Referrals and appointments  In addition, I have reviewed and discussed with patient certain preventive protocols, quality metrics, and best practice recommendations. A written personalized care plan for preventive services as well as general preventive health recommendations were provided to patient.     Mliss Graff, LPN   [id number]   After Visit Summary: (MyChart) Due to this being a telephonic visit, the after visit summary with patients personalized plan was offered to patient via MyChart   Nurse Notes:

## 2023-09-29 NOTE — Patient Instructions (Signed)
 Kelli Terrell , Thank you for taking time to come for your Medicare Wellness Visit. I appreciate your ongoing commitment to your health goals. Please review the following plan we discussed and let me know if I can assist you in the future.   Screening recommendations/referrals: Colonoscopy: no longer required Mammogram: up to date Bone Density: up to date Recommended yearly ophthalmology/optometry visit for glaucoma screening and checkup Recommended yearly dental visit for hygiene and checkup  Vaccinations: Influenza vaccine:  Pneumococcal vaccine:  Tdap vaccine:  Shingles vaccine:        Preventive Care 65 Years and Older, Female Preventive care refers to lifestyle choices and visits with your health care provider that can promote health and wellness. What does preventive care include? A yearly physical exam. This is also called an annual well check. Dental exams once or twice a year. Routine eye exams. Ask your health care provider how often you should have your eyes checked. Personal lifestyle choices, including: Daily care of your teeth and gums. Regular physical activity. Eating a healthy diet. Avoiding tobacco and drug use. Limiting alcohol use. Practicing safe sex. Taking low-dose aspirin every day. Taking vitamin and mineral supplements as recommended by your health care provider. What happens during an annual well check? The services and screenings done by your health care provider during your annual well check will depend on your age, overall health, lifestyle risk factors, and family history of disease. Counseling  Your health care provider may ask you questions about your: Alcohol use. Tobacco use. Drug use. Emotional well-being. Home and relationship well-being. Sexual activity. Eating habits. History of falls. Memory and ability to understand (cognition). Work and work Astronomer. Reproductive health. Screening  You may have the following tests or  measurements: Height, weight, and BMI. Blood pressure. Lipid and cholesterol levels. These may be checked every 5 years, or more frequently if you are over 12 years old. Skin check. Lung cancer screening. You may have this screening every year starting at age 75 if you have a 30-pack-year history of smoking and currently smoke or have quit within the past 15 years. Fecal occult blood test (FOBT) of the stool. You may have this test every year starting at age 50. Flexible sigmoidoscopy or colonoscopy. You may have a sigmoidoscopy every 5 years or a colonoscopy every 10 years starting at age 75. Hepatitis C blood test. Hepatitis B blood test. Sexually transmitted disease (STD) testing. Diabetes screening. This is done by checking your blood sugar (glucose) after you have not eaten for a while (fasting). You may have this done every 1-3 years. Bone density scan. This is done to screen for osteoporosis. You may have this done starting at age 75. Mammogram. This may be done every 1-2 years. Talk to your health care provider about how often you should have regular mammograms. Talk with your health care provider about your test results, treatment options, and if necessary, the need for more tests. Vaccines  Your health care provider may recommend certain vaccines, such as: Influenza vaccine. This is recommended every year. Tetanus, diphtheria, and acellular pertussis (Tdap, Td) vaccine. You may need a Td booster every 10 years. Zoster vaccine. You may need this after age 75. Pneumococcal 13-valent conjugate (PCV13) vaccine. One dose is recommended after age 75. Pneumococcal polysaccharide (PPSV23) vaccine. One dose is recommended after age 75. Talk to your health care provider about which screenings and vaccines you need and how often you need them. This information is not intended to replace advice  given to you by your health care provider. Make sure you discuss any questions you have with your  health care provider. Document Released: 01/24/2015 Document Revised: 09/17/2015 Document Reviewed: 10/29/2014 Elsevier Interactive Patient Education  2017 [employer].  Fall Prevention in the Home Falls can cause injuries. They can happen to people of all ages. There are many things you can do to make your home safe and to help prevent falls. What can I do on the outside of my home? Regularly fix the edges of walkways and driveways and fix any cracks. Remove anything that might make you trip as you walk through a door, such as a raised step or threshold. Trim any bushes or trees on the path to your home. Use bright outdoor lighting. Clear any walking paths of anything that might make someone trip, such as rocks or tools. Regularly check to see if handrails are loose or broken. Make sure that both sides of any steps have handrails. Any raised decks and porches should have guardrails on the edges. Have any leaves, snow, or ice cleared regularly. Use sand or salt on walking paths during winter. Clean up any spills in your garage right away. This includes oil or grease spills. What can I do in the bathroom? Use night lights. Install grab bars by the toilet and in the tub and shower. Do not use towel bars as grab bars. Use non-skid mats or decals in the tub or shower. If you need to sit down in the shower, use a plastic, non-slip stool. Keep the floor dry. Clean up any water that spills on the floor as soon as it happens. Remove soap buildup in the tub or shower regularly. Attach bath mats securely with double-sided non-slip rug tape. Do not have throw rugs and other things on the floor that can make you trip. What can I do in the bedroom? Use night lights. Make sure that you have a light by your bed that is easy to reach. Do not use any sheets or blankets that are too big for your bed. They should not hang down onto the floor. Have a firm chair that has side arms. You can use this for  support while you get dressed. Do not have throw rugs and other things on the floor that can make you trip. What can I do in the kitchen? Clean up any spills right away. Avoid walking on wet floors. Keep items that you use a lot in easy-to-reach places. If you need to reach something above you, use a strong step stool that has a grab bar. Keep electrical cords out of the way. Do not use floor polish or wax that makes floors slippery. If you must use wax, use non-skid floor wax. Do not have throw rugs and other things on the floor that can make you trip. What can I do with my stairs? Do not leave any items on the stairs. Make sure that there are handrails on both sides of the stairs and use them. Fix handrails that are broken or loose. Make sure that handrails are as long as the stairways. Check any carpeting to make sure that it is firmly attached to the stairs. Fix any carpet that is loose or worn. Avoid having throw rugs at the top or bottom of the stairs. If you do have throw rugs, attach them to the floor with carpet tape. Make sure that you have a light switch at the top of the stairs and the bottom of  the stairs. If you do not have them, ask someone to add them for you. What else can I do to help prevent falls? Wear shoes that: Do not have high heels. Have rubber bottoms. Are comfortable and fit you well. Are closed at the toe. Do not wear sandals. If you use a stepladder: Make sure that it is fully opened. Do not climb a closed stepladder. Make sure that both sides of the stepladder are locked into place. Ask someone to hold it for you, if possible. Clearly mark and make sure that you can see: Any grab bars or handrails. First and last steps. Where the edge of each step is. Use tools that help you move around (mobility aids) if they are needed. These include: Canes. Walkers. Scooters. Crutches. Turn on the lights when you go into a dark area. Replace any light bulbs as soon  as they burn out. Set up your furniture so you have a clear path. Avoid moving your furniture around. If any of your floors are uneven, fix them. If there are any pets around you, be aware of where they are. Review your medicines with your doctor. Some medicines can make you feel dizzy. This can increase your chance of falling. Ask your doctor what other things that you can do to help prevent falls. This information is not intended to replace advice given to you by your health care provider. Make sure you discuss any questions you have with your health care provider. Document Released: 10/24/2008 Document Revised: 06/05/2015 Document Reviewed: 02/01/2014 Elsevier Interactive Patient Education  2017 [employer].

## 2023-09-29 NOTE — ED Provider Notes (Signed)
 [facility]    CSN: [id number] Arrival date & time: 09/29/23  1516      History   Chief Complaint No chief complaint on file.   HPI Kelli Terrell is a 73 y.o. female.   The history is provided by the patient.   Patient presents with a 2-day history of generalized fatigue, fever, chills, headache, nasal congestion, bilateral ear pain, and cough.  Patient denies ear drainage, wheezing, difficulty breathing, abdominal pain, nausea, vomiting, diarrhea, or rash.  Patient states since her symptoms started, she has been taking Cold-Eeze with minimal relief.  She denies any obvious close sick contacts.  Past Medical History:  Diagnosis Date   Aneurysm of ascending aorta (HCC)    AVM (arteriovenous malformation) of colon without hemorrhage    GERD (gastroesophageal reflux disease)    Hyperlipidemia    Hypertension    Internal hemorrhoids     Patient Active Problem List   Diagnosis Date Noted   Melena 02/06/2019   Thrombocytopenia (HCC) 02/05/2019   COVID-19 virus infection    ASCUS (atypical squamous cells of undetermined significance) on Pap smear 12/14/2012   Hyperlipidemia    Hypertension    GERD (gastroesophageal reflux disease)     Past Surgical History:  Procedure Laterality Date   CHOLECYSTECTOMY     TONSILLECTOMY      OB History   No obstetric history on file.      Home Medications    Prior to Admission medications   Medication Sig Start Date End Date Taking? Authorizing Provider  fluticasone  (FLONASE ) 50 MCG/ACT nasal spray Place 2 sprays into both nostrils daily. 09/29/23  Yes Leath-Warren, Etta PARAS, NP  nirmatrelvir/ritonavir (PAXLOVID , 300/100,) 20 x 150 MG & 10 x 100MG  TBPK Take 3 tablets by mouth 2 (two) times daily for 5 days. Patient GFR is > 60. Take nirmatrelvir (150 mg) two tablets twice daily for 5 days and ritonavir (100 mg) one tablet twice daily for 5 days. 09/29/23 10/04/23 Yes Leath-Warren, Etta PARAS, NP   promethazine -dextromethorphan (PROMETHAZINE -DM) 6.25-15 MG/5ML syrup Take 5 mLs by mouth 4 (four) times daily as needed. 09/29/23  Yes Leath-Warren, Etta PARAS, NP  ALPRAZolam  (XANAX ) 0.5 MG tablet Take 1 tablet (0.5 mg total) by mouth 3 (three) times daily as needed for anxiety or sleep. Patient not taking: Reported on 09/29/2023 03/15/22   Duanne Butler DASEN, MD  hydrochlorothiazide  (HYDRODIURIL ) 25 MG tablet Take 1 tablet (25 mg total) by mouth daily. 09/20/23   Duanne Butler DASEN, MD  Krill Oil 500 MG CAPS Take 1,600 mg by mouth.    [provider]  Multiple Vitamins-Minerals (ICAPS AREDS 2 PO) Take by mouth.    [provider]  omeprazole  (PRILOSEC) 40 MG capsule Take 1 capsule (40 mg total) by mouth daily. 12/29/22   Duanne Butler DASEN, MD    Family History Family History  Problem Relation Age of Onset   Cancer Father        kidney   Stroke Brother    Cancer Brother        Thyroid  Cancer   Heart disease Brother     Social History Social History   Tobacco Use   Smoking status: Never   Smokeless tobacco: Never  Substance Use Topics   Alcohol use: Yes    Comment: Rare   Drug use: No     Allergies   Mobic  [meloxicam ] and Codeine   Review of Systems Review of Systems Per HPI  Physical Exam Triage Vital  Signs ED Triage Vitals  Encounter Vitals Group     BP 09/29/23 1539 116/71     Girls Systolic BP Percentile --      Girls Diastolic BP Percentile --      Boys Systolic BP Percentile --      Boys Diastolic BP Percentile --      Pulse Rate 09/29/23 1539 89     Resp 09/29/23 1539 18     Temp 09/29/23 1539 98.8 F (37.1 C)     Temp Source 09/29/23 1539 Oral     SpO2 09/29/23 1539 95 %     Weight --      Height --      Head Circumference --      Peak Flow --      Pain Score 09/29/23 1540 4     Pain Loc --      Pain Education --      Exclude from Growth Chart --    No data found.  Updated Vital Signs BP 116/71 (BP Location: Left Arm)   Pulse 89    Temp 98.8 F (37.1 C) (Oral)   Resp 18   SpO2 95%   Visual Acuity Right Eye Distance:   Left Eye Distance:   Bilateral Distance:    Right Eye Near:   Left Eye Near:    Bilateral Near:     Physical Exam Vitals and nursing note reviewed.  Constitutional:      General: She is not in acute distress.    Appearance: Normal appearance.  HENT:     Head: Normocephalic.     Right Ear: Tympanic membrane, ear canal and external ear normal.     Left Ear: Tympanic membrane, ear canal and external ear normal.     Nose: Congestion present.     Right Turbinates: Enlarged and swollen.     Left Turbinates: Enlarged and swollen.     Right Sinus: No maxillary sinus tenderness or frontal sinus tenderness.     Left Sinus: No maxillary sinus tenderness or frontal sinus tenderness.     Mouth/Throat:     Lips: Pink.     Mouth: Mucous membranes are moist.     Pharynx: Uvula midline. Postnasal drip present. No pharyngeal swelling, oropharyngeal exudate, posterior oropharyngeal erythema or uvula swelling.     Comments: Cobblestoning present to posterior oropharynx  Eyes:     Extraocular Movements: Extraocular movements intact.     Conjunctiva/sclera: Conjunctivae normal.     Pupils: Pupils are equal, round, and reactive to light.  Cardiovascular:     Rate and Rhythm: Normal rate and regular rhythm.     Pulses: Normal pulses.     Heart sounds: Normal heart sounds.  Pulmonary:     Effort: Pulmonary effort is normal. No respiratory distress.     Breath sounds: Normal breath sounds. No stridor. No wheezing, rhonchi or rales.  Abdominal:     General: Bowel sounds are normal.     Palpations: Abdomen is soft.     Tenderness: There is no abdominal tenderness.  Musculoskeletal:     Cervical back: Normal range of motion.  Lymphadenopathy:     Cervical: No cervical adenopathy.  Skin:    General: Skin is warm and dry.  Neurological:     General: No focal deficit present.     Mental Status: She is  alert and oriented to person, place, and time.  Psychiatric:        Mood and Affect: Mood  normal.        Behavior: Behavior normal.      UC Treatments / Results  Labs (all labs ordered are listed, but only abnormal results are displayed) Labs Reviewed  POC SOFIA SARS ANTIGEN FIA - Abnormal; Notable for the following components:      Result Value   SARS Coronavirus 2 Ag Positive (*)    All other components within normal limits    EKG   Radiology No results found.  Procedures Procedures (including critical care time)  Medications Ordered in UC Medications - No data to display  Initial Impression / Assessment and Plan / UC Course  I have reviewed the triage vital signs and the nursing notes.  Pertinent labs & imaging results that were available during my care of the patient were reviewed by me and considered in my medical decision making (see chart for details).  COVID test is positive.  Patient has elected to begin Paxlovid .  Patient denies history of kidney disease, or use of statin medications.  Per review of chart, most recent GFR was 03/11/23 which was greater than 60.  Symptomatic treatment also provided with Promethazine  DM for the cough, and fluticasone  50 micro nasal spray for nasal congestion and runny nose.  Supportive care recommendations were provided and discussed with the patient to include fluids, rest, over-the-counter analgesics, use of normal saline nasal spray, and use of a humidifier during sleep.  Patient was given strict ER follow-up precautions, along with indications to follow-up with her PCP.  Patient was in agreement with this plan of care and verbalizes understanding.  All questions were answered.  Patient stable for discharge.   Final Clinical Impressions(s) / UC Diagnoses   Final diagnoses:  COVID     Discharge Instructions      Your COVID test was positive. Take medication as prescribed.  The cough medicine prescribed for you today may cause  drowsiness.  Recommend taking the medication at bedtime.  You may take over-the-counter Robitussin, Delsym, or Mucinex  during the daytime. Increase fluids and allow for plenty of rest. You may take over-the-counter Tylenol  as needed for pain, fever, or general discomfort. Recommend normal saline nasal spray throughout the day for nasal congestion and runny nose. For your cough, recommend use of a humidifier in your bedroom at nighttime during sleep and sleeping elevated on pillows while cough symptoms persist. You will need to remain home until you have been fever free for 24 hours with no medication. Go to the emergency department if you experience shortness of breath, difficulty breathing, or other concerns. You may follow-up with your primary care physician if symptoms fail to improve. Follow-up as needed.     ED Prescriptions     Medication Sig Dispense Auth. Provider   nirmatrelvir/ritonavir (PAXLOVID , 300/100,) 20 x 150 MG & 10 x 100MG  TBPK Take 3 tablets by mouth 2 (two) times daily for 5 days. Patient GFR is > 60. Take nirmatrelvir (150 mg) two tablets twice daily for 5 days and ritonavir (100 mg) one tablet twice daily for 5 days. 30 tablet Leath-Warren, Etta PARAS, NP   promethazine -dextromethorphan (PROMETHAZINE -DM) 6.25-15 MG/5ML syrup Take 5 mLs by mouth 4 (four) times daily as needed. 118 mL Leath-Warren, Etta PARAS, NP   fluticasone  (FLONASE ) 50 MCG/ACT nasal spray Place 2 sprays into both nostrils daily. 16 g Leath-Warren, Etta PARAS, NP      PDMP not reviewed this encounter.   Gilmer Etta PARAS, NP 09/29/23 1600

## 2023-09-29 NOTE — ED Triage Notes (Signed)
 Fatigue, chills, sneezing, nasal congestion, bilateral ear pain, since Monday.

## 2023-10-05 DIAGNOSIS — Z1231 Encounter for screening mammogram for malignant neoplasm of breast: Secondary | ICD-10-CM | POA: Diagnosis not present

## 2023-10-05 LAB — HM MAMMOGRAPHY

## 2023-10-06 DIAGNOSIS — Z85828 Personal history of other malignant neoplasm of skin: Secondary | ICD-10-CM | POA: Diagnosis not present

## 2023-10-06 DIAGNOSIS — L814 Other melanin hyperpigmentation: Secondary | ICD-10-CM | POA: Diagnosis not present

## 2023-10-06 DIAGNOSIS — D2262 Melanocytic nevi of left upper limb, including shoulder: Secondary | ICD-10-CM | POA: Diagnosis not present

## 2023-10-06 DIAGNOSIS — D1801 Hemangioma of skin and subcutaneous tissue: Secondary | ICD-10-CM | POA: Diagnosis not present

## 2023-10-06 DIAGNOSIS — D220 Melanocytic nevi of lip: Secondary | ICD-10-CM | POA: Diagnosis not present

## 2023-10-06 DIAGNOSIS — L821 Other seborrheic keratosis: Secondary | ICD-10-CM | POA: Diagnosis not present

## 2023-10-06 DIAGNOSIS — L570 Actinic keratosis: Secondary | ICD-10-CM | POA: Diagnosis not present

## 2023-10-06 DIAGNOSIS — I788 Other diseases of capillaries: Secondary | ICD-10-CM | POA: Diagnosis not present

## 2023-10-07 ENCOUNTER — Encounter: Payer: Self-pay | Admitting: Family Medicine

## 2024-01-18 ENCOUNTER — Other Ambulatory Visit: Payer: Self-pay | Admitting: Family Medicine

## 2024-01-18 DIAGNOSIS — K219 Gastro-esophageal reflux disease without esophagitis: Secondary | ICD-10-CM

## 2024-03-19 ENCOUNTER — Other Ambulatory Visit

## 2024-03-22 ENCOUNTER — Encounter: Admitting: Family Medicine
